# Patient Record
Sex: Female | Born: 1985 | Race: White | Hispanic: No | Marital: Single | Smoking: Never smoker
Health system: Southern US, Community
[De-identification: ages and names within clinical notes are randomized; demographics above are authoritative.]

## PROBLEM LIST (undated history)

## (undated) DIAGNOSIS — D649 Anemia, unspecified: Secondary | ICD-10-CM

## (undated) DIAGNOSIS — T7840XA Allergy, unspecified, initial encounter: Secondary | ICD-10-CM

## (undated) DIAGNOSIS — F419 Anxiety disorder, unspecified: Secondary | ICD-10-CM

## (undated) HISTORY — PX: TUBAL LIGATION: SHX77

## (undated) HISTORY — DX: Allergy, unspecified, initial encounter: T78.40XA

## (undated) HISTORY — DX: Anemia, unspecified: D64.9

---

## 2012-07-05 ENCOUNTER — Ambulatory Visit (INDEPENDENT_AMBULATORY_CARE_PROVIDER_SITE_OTHER): Payer: BC Managed Care – PPO | Admitting: Family Medicine

## 2012-07-05 ENCOUNTER — Encounter: Payer: Self-pay | Admitting: Family Medicine

## 2012-07-05 VITALS — BP 114/70 | Wt 160.0 lb

## 2012-07-05 DIAGNOSIS — S139XXA Sprain of joints and ligaments of unspecified parts of neck, initial encounter: Secondary | ICD-10-CM

## 2012-07-05 DIAGNOSIS — S161XXA Strain of muscle, fascia and tendon at neck level, initial encounter: Secondary | ICD-10-CM

## 2012-07-05 NOTE — Progress Notes (Signed)
  Subjective:    Patient ID: Wanda Fleming, female    DOB: 09/29/1985, 26 y.o.   MRN: [id number]  HPI All walking her dog at night in an apartment complex, she slipped on leaves, fell backwards and hit her head she did not lose consciousness. She did have difficulty with headache after that. But no blurred vision, double vision ,nausea or vomiting. She also noted some left-sided neck pain next day. She notes the pain today but it has diminished slightly . No numbness, tingling or weakness in her arms.   Review of Systems     Objective:   Physical Exam Alert and in no distress. Full motion of the neck. No tenderness to palpation of the neck noted. Normal motor, sensory and DTRs.       Assessment & Plan:   1. Cervical strain, acute    recommend conservative care with heat and stretching as well as anti-inflammatory. If no improvement over the next several weeks, return here for reevaluation.

## 2012-07-05 NOTE — Patient Instructions (Signed)
Heat for 20 minutes 3 times per day. Stretch after you do the heat and you can use Advil or Aleve for pain relief. If you don't get back to normal after couple weeks give me a call come back in. You can take 2 Aleve twice a day

## 2012-08-07 ENCOUNTER — Encounter: Payer: Self-pay | Admitting: Family Medicine

## 2012-08-07 ENCOUNTER — Ambulatory Visit (INDEPENDENT_AMBULATORY_CARE_PROVIDER_SITE_OTHER): Payer: BC Managed Care – PPO | Admitting: Family Medicine

## 2012-08-07 VITALS — BP 100/74 | HR 72 | Ht 65.75 in | Wt 162.0 lb

## 2012-08-07 DIAGNOSIS — M79673 Pain in unspecified foot: Secondary | ICD-10-CM

## 2012-08-07 DIAGNOSIS — M79609 Pain in unspecified limb: Secondary | ICD-10-CM

## 2012-08-07 NOTE — Progress Notes (Signed)
Chief Complaint  Patient presents with  . Foot Pain    right foot pain x 3 weeks. Foot has been "popping." Pain around great toe area.    HPI:  Started having pain at the base of her big toe, into foot.  She has had some popping, possibly "in and out of place".  Popping isn't painful.  Yesterday it started hurting more while wearing boots, and by the end of the day it was much worse, and hard to bear weight on it.  This morning it is a little better.  Denies it ever swelling.  Iced it last night.  Denies any injury or trauma to right foot.  Also having some stiffness and discomfort in left ankle. Saw podiatrist and diagnosed with tendinitis.  Offered cortisone shot, but she declined, and used some OTC NSAIDs instead.  Used it only sporadically as it sometimes upset her stomach.  No past medical history on file. No past surgical history on file. Current outpatient prescriptions:Copper IUD, by Intrauterine route., Disp: , Rfl:  No Known Allergies  ROS:  Denies fevers, URI symptoms, cough, shortness of breath, abdominal pain, nausea, vomiting, diarrhea, skin rash, chest pain.  +neck pain, L ankle pain, R foot pain as per HPI  PHYSICAL EXAM: BP 100/74  Pulse 72  Ht 5' 5.75" (1.67 m)  Wt 162 lb (73.483 kg)  BMI 26.35 kg/m2  LMP 07/12/2012 Well developed, talkative female in no distress R foot--2+ pulse.  No edema Mild diffuse tenderness along L great toe--along top of toe, and along bottom (mild pain in this area with flexion/extension of toe against resistance).  Normal strength, sensation.  nontender at 1st MTP No effusion/warmth  ASSESSMENT/PLAN: 1. Foot pain    Likely is strain vs tendonitis, likely related to shoewear.  No evidence of significant arthritis or gout. OTC Aleve up to 2 BID x 10 days.  NSAID precautions reviewed. Proper shoewear--ensure wide enough, avoid heels, good supportive shoes--add arch support to her unsupportive shoes she has Consider seeing podiatrist for f/u  if ongoing feet pain

## 2012-08-07 NOTE — Patient Instructions (Signed)
Wear supportive shoes--add arch support if needed, vs getting new shoes.  Avoid heels.  Try and make sure shoes aren't too narrow, which put pressure on the painful joint at the base of the great toe.  Use Aleve 2 tablets twice daily with food for up to 10-14 days--use regularly until completely better (this should help your toe, ankle, neck, etc.).  Stop using, or decrease the dose, if it bothers your stomach.  Consider following up with podiatrist if having ongoing problems, as they can make orthotics

## 2012-12-24 ENCOUNTER — Encounter: Payer: Self-pay | Admitting: Family Medicine

## 2012-12-24 ENCOUNTER — Ambulatory Visit (INDEPENDENT_AMBULATORY_CARE_PROVIDER_SITE_OTHER): Payer: BC Managed Care – PPO | Admitting: Family Medicine

## 2012-12-24 VITALS — BP 120/76 | HR 72 | Ht 65.75 in | Wt 154.0 lb

## 2012-12-24 DIAGNOSIS — M62838 Other muscle spasm: Secondary | ICD-10-CM

## 2012-12-24 NOTE — Progress Notes (Signed)
Chief Complaint  Patient presents with  . Back Pain    and shoulder pain, seen at Urgent Care yesterday. Not getting much relief from the meds she is taking.   Went to [facility] with complaint of spasm in right shoulder and shoulder blade.  Started suddenly after reaching to get soap.  She was rx'd soma, diclofenac DR and vicodin.  She first took diclofenac and soma at 4:30, didn't get relief, so took hydrocodone at 6:30 (2 tabs).  That also didn't seem to help.  Took another muscle relaxer at bedtime, but other than feeling slightly tired, got no relief.  She has been icing the area--?slight benefit.  She woke up at 12:30 am with nausea, vomiting, clammy.  After vomiting, she was able to have some saltines and get back to bed.  Today she has a little more range of motion, but still is in a lot of pain.  She didn't take anything today yet, afraid of which to take, what made her sick.  Denies getting any sort of sedation from the medications.  She was told in Urgent Care that she should be feeling a lot better today, and she isn't.  History reviewed. No pertinent past medical history. History reviewed. No pertinent past surgical history. History   Social History  . Marital Status: Single    Spouse Name: N/A    Number of Children: N/A  . 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 Smoking status: Never Smoker   . Smokeless tobacco: Never Used  . Alcohol Use: Yes     Comment: 3 drinks per week.  . Drug Use: No  . Sexually Active: Yes -- Female partner(s)    Birth Control/ Protection: IUD, Condom     Comment: copper IUD   Other Topics Concern  . Not on file   Social History Narrative   Lives alone, with 1 dog   Current outpatient prescriptions:carisoprodol (SOMA) 350 MG tablet, Take 175-350 mg by mouth 3 (three) times daily as needed for muscle spasms., Disp: , Rfl: ;  Copper IUD, by Intrauterine route., Disp: , Rfl:  ;  diclofenac (VOLTAREN) 75 MG EC tablet, Take 75 mg by mouth 2 (two) times daily., Disp: , Rfl: ;  HYDROcodone-acetaminophen (NORCO/VICODIN) 5-325 MG per tablet, Take 1 tablet by mouth every 6 (six) hours as needed for pain., Disp: , Rfl:   No Known Allergies  ROS:  Denies fevers, URI symptoms, chest pain, shortness of breath, numbness, tingling, weakness, skin rash.  She has been having some left knee instability--denies pain, but sometimes feels weak.  No swelling or recent injury.  PHYSICAL EXAM: BP 120/76  Pulse 72  Ht 5' 5.75" (1.67 m)  Wt 154 lb (69.854 kg)  BMI 25.05 kg/m2  LMP 12/05/2012 Well developed female, doesn't appear to be in acute distress Neck: no c-spine tenderness, no lymphadenopathy or mass Back: Spine nontender.  No CVA tenderness Minimally tender over R trapezius Very tender with spasm over R rhomboid muscles DTR's 2+ and symmetric.  Normal strength, sensation Skin: no rashes or lesions  ASSESSMENT/PLAN:  Muscle spasm of rhomboid muscles on right, and some in R trapezius.  No history or physical exam suggestions of radiculopathy, just muscle spasm. Offered (and declined) toradol  Discussed heat, stretches, massage She may switch to Aleve if repeat diclofenac dose causes side effcts or is ineffective (she reports Aleve usually helps--discussed can  substitute 2 Aleve twice daily with food in place of the diclofenac if she prefers) Continue soma TID prn muscle spasm, aware of possible sedating effects  Discussed massage, and referral to PT if not improving with these measures.  Use the hydrocodone only prn severe pain.  If pain not severe, but inadequately controlled with other meds, may use tylenol in place of the narcotic (but knows not to use them together, both containing acetaminophen).  All questions were answered (she had many). Advised to return another time to discuss her knee issues

## 2012-12-24 NOTE — Patient Instructions (Signed)
Continue the muscle relaxant as needed. Continue EITHER diclofenac OR Aleve I recommend trial of heat, stretches and massage. You may use the hydrocodone only if needed for severe pain--the most likely to cause nausea of all the meds.  Call for referral to PT if ongoing problems.  Return for re-evaluation if worsening pain, weakness, numbness, or other symptoms develop.

## 2013-06-20 ENCOUNTER — Other Ambulatory Visit: Payer: Self-pay

## 2013-12-18 ENCOUNTER — Encounter: Payer: Self-pay | Admitting: Family Medicine

## 2013-12-18 ENCOUNTER — Ambulatory Visit (INDEPENDENT_AMBULATORY_CARE_PROVIDER_SITE_OTHER): Payer: BC Managed Care – PPO | Admitting: Family Medicine

## 2013-12-18 VITALS — BP 120/74 | HR 72 | Ht 67.0 in | Wt 152.0 lb

## 2013-12-18 DIAGNOSIS — M222X9 Patellofemoral disorders, unspecified knee: Secondary | ICD-10-CM

## 2013-12-18 DIAGNOSIS — M25569 Pain in unspecified knee: Secondary | ICD-10-CM

## 2013-12-18 DIAGNOSIS — M25562 Pain in left knee: Secondary | ICD-10-CM

## 2013-12-18 NOTE — Patient Instructions (Signed)
Patellofemoral Syndrome  If you have had pain in the front of your knee for a long time, chances are good that you have patellofemoral syndrome. The word patella refers to the kneecap. Femoral (or femur) refers to the thigh bone. That is the bone the kneecap sits on. The kneecap is shaped like a triangle. Its job is to protect the knee and to improve the efficiency of your thigh muscles (quadriceps). The underside of the kneecap is made of smooth tissue (cartilage). This lets the kneecap slide up and down as the knee moves. Sometimes this cartilage becomes soft. Your healthcare provider may say the cartilage breaks down. That is patellofemoral syndrome. It can affect one knee, or both. The condition is sometimes called patellofemoral pain syndrome. That is because the condition is painful. The pain usually gets worse with activity. Sitting for a long time with the knee bent also makes the pain worse. It usually gets better with rest and proper treatment.  CAUSES   No one is sure why some people develop this problem and others do not. Runners often get it. One name for the condition is "runner's knee." However, some people run for years and never have knee pain. Certain things seem to make patellofemoral syndrome more likely. They include:  · Moving out of alignment. The kneecap is supposed to move in a straight line when the thigh muscle pulls on it. Sometimes the kneecap moves in poor alignment. That can make the knee swell and hurt. Some experts believe it also wears down the cartilage.  · Injury to the kneecap.  · Strain on the knee. This may occur during sports activity. Soccer, running, skiing and cycling can put excess stress on the knee.  · Being flat-footed or knock-kneed.  SYMPTOMS   · Knee pain.  · Pain under the kneecap. This is usually a dull, aching pain.  · Pain in the knee when doing certain things: squatting, kneeling, going up or down stairs.  · Pain in the knee when you stand up after sitting down  for awhile.  · Tightness in the knee.  · Loss of muscle strength in the thigh.  · Swelling of the knee.  DIAGNOSIS   Healthcare providers often send people with knee pain to an orthopedic caregiver. This person has special training to treat problems with bones and joints. To decide what is causing your knee pain, your caregiver will probably:  · Do a physical exam. This will probably include:  · Asking about symptoms you have noticed.  · Asking about your activities and any injuries.  · Feeling your knee. Moving it. This will help test the knee's strength. It will also check alignment (whether the knee and leg are aligned normally).  · Order some tests, such as:  · Imaging tests. They create pictures of the inside of the knee. Tests may include:  · X-rays.  · Computed tomography (CT) scan. This uses X-rays and a computer to show more detail.  · Magnetic resonance imaging (MRI). This test uses magnets, radio waves and a computer to make pictures.  TREATMENT   · Medication is almost always used first. It can relieve pain. It also can reduce swelling. Non-steroidal anti-inflammatory medicines (called NSAIDs) are usually suggested. Sometimes a stronger form is needed. A stronger form would require a prescription.  · Other treatment may be needed after the swelling goes down. Possibilities include:  · Exercise. Certain exercises can make the muscles around the knee stronger which decreases the   pressure on the knee cap. This includes the thigh muscle. Certain exercises also may be suggested to increase your flexibility.  · A knee brace. This gives the knee extra support and helps align the movement of the knee cap.  · Orthotics. These are special shoe inserts. They can help keep your leg and knee aligned.  · Surgery is sometimes needed. This is rare. Options include:  · Arthroscopy. The surgeon uses a special tool to remove any damaged pieces of the kneecap. Only a few small incisions (cuts) are needed.  · Realignment.  This is open surgery. The goals are to reduce pressure and fix the way the kneecap moves.  HOME CARE INSTRUCTIONS   · Take any medication prescribed by your healthcare provider. Follow the directions carefully.  · If your knee is swollen:  · Put ice or cold packs on it. Do this for 20 to 30 minutes, 3 to 4 times a day.  · Keep the knee raised. Make sure it is supported. Put a pillow under it.  · Rest your knee. For example, take the elevator instead of the stairs for awhile. Or, take a break from sports activity that strain your knee. Try walking or swimming instead.  · Whenever you are active:  · Use an elastic bandage on your knee. This gives it support.  · After any activity, put ice or cold packs on your knees. Do this for about 10 to 20 minutes.  · Make sure you wear shoes that give good support. Make sure they are not worn down. The heels should not slant in or out.  SEEK MEDICAL CARE IF:   · Knee pain gets worse. Or it does not go away, even after taking pain medicine.  · Swelling does not go down.  · Your thigh muscle becomes weak.  · You have an oral temperature above 102° F (38.9° C).  SEEK IMMEDIATE MEDICAL CARE IF:   You have an oral temperature above 102° F (38.9° C), not controlled by medicine.  Document Released: 07/20/2009 Document Revised: 10/24/2011 Document Reviewed: 07/20/2009  Exit[employer]® Patient Information ©2014 [employer].
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
## 2013-12-31 ENCOUNTER — Telehealth: Payer: Self-pay | Admitting: Family Medicine

## 2013-12-31 NOTE — Telephone Encounter (Signed)
She does not need OV if just requesting referral for PT.  Is there a particular place she is requesting?  [facility] for PT?

## 2013-12-31 NOTE — Telephone Encounter (Signed)
She was recently seen for knee pain, mentioned briefly her upper back pain, that chiro had been helping some.  I haven't evaluated her for this pain since 12/2012.  I am happy to refer her to PT, based on our discussion.  If she is asking referral elsewhere, she would need eval in order for me to determine diagnosis and who to send to (I doubt she actually needs anything more than PT).  Okay to refer to PT for evaluation and treatment of her pain--confirm that it was upper back pain; refer to [facility], unless she has a preference for elsewhere

## 2013-12-31 NOTE — Telephone Encounter (Signed)
LMOM FOR THE PATIENT TO CB. CLS 

## 2013-12-31 NOTE — Telephone Encounter (Signed)
Dr. Lynelle DoctorKnapp I spoke with the patient and she states that she is more interested in getting a massage and a structural integrated therapy setting. She needs it this way so it will be covered under her flex plan. Is it okay to do the referral or does she need to come in for a OV? CLS

## 2014-01-01 NOTE — Telephone Encounter (Signed)
I fax over the referral to [facility] for a therapy massage  Per patient request and Dr. Lynelle DoctorKnapp approved. CLS They will contact the patient to set up the appointment. CLS [address] [place], [place] [id number] [phone]

## 2014-02-13 ENCOUNTER — Telehealth: Payer: Self-pay | Admitting: Internal Medicine

## 2014-02-13 NOTE — Telephone Encounter (Signed)
written

## 2014-02-13 NOTE — Telephone Encounter (Signed)
Pt called back and the place Theodoro GristCheryl Dalton works at is [employer]

## 2014-02-13 NOTE — Telephone Encounter (Signed)
Pt states she needs a referral order for [facility] for back and neck pain, this is a long term therapy for 12-22 months She has an appt tomorrow with Theodoro Gristheryl Dalton. Pt has seen a chiropiractor for over a year and nothing has helped. She got a call from [facility] for an appt for massage therapy but she is not wanting to go there. If there is any questions you have, pt states to call her @ [phone]

## 2014-02-13 NOTE — Telephone Encounter (Signed)
She wasn't seen for back/neck pain.  She was last seen 12/2013 for knee pain.  She was seen 12/2012 for acute right shoulder pain with muscle spasm. We had talked about PT at both of these visits.  Has she checked with her insurance to see if ROLF therapy is covered? (and also whether or not referral is needed?)  We generally don't do referrals for patients that haven't been evaluated here for the specific problem. I honestly don't have much experience with ROLF therapy, to know how it might be different from types of therapy that are offered from routine PT (that has been recommended)

## 2014-02-13 NOTE — Telephone Encounter (Signed)
Patient advised.

## 2014-02-13 NOTE — Telephone Encounter (Signed)
Spoke with patient and she believes that ROLFING in NOT covered through her insurance. If she has an rx she can however use her flex account. She states that rx should say "[facility]rapeutic [facility] as needed for neck and back pain." I told her I would ask if you were willing to write and I would call her back.

## 2014-10-27 ENCOUNTER — Ambulatory Visit (INDEPENDENT_AMBULATORY_CARE_PROVIDER_SITE_OTHER): Payer: BLUE CROSS/BLUE SHIELD | Admitting: Family Medicine

## 2014-10-27 ENCOUNTER — Other Ambulatory Visit: Payer: Self-pay | Admitting: Family Medicine

## 2014-10-27 ENCOUNTER — Encounter: Payer: Self-pay | Admitting: Family Medicine

## 2014-10-27 VITALS — BP 100/60 | HR 76 | Ht 67.0 in | Wt 154.6 lb

## 2014-10-27 DIAGNOSIS — E559 Vitamin D deficiency, unspecified: Secondary | ICD-10-CM

## 2014-10-27 DIAGNOSIS — Z114 Encounter for screening for human immunodeficiency virus [HIV]: Secondary | ICD-10-CM

## 2014-10-27 DIAGNOSIS — R5383 Other fatigue: Secondary | ICD-10-CM

## 2014-10-27 DIAGNOSIS — F411 Generalized anxiety disorder: Secondary | ICD-10-CM

## 2014-10-27 DIAGNOSIS — Z13220 Encounter for screening for lipoid disorders: Secondary | ICD-10-CM | POA: Diagnosis not present

## 2014-10-27 LAB — CBC WITH DIFFERENTIAL/PLATELET
BASOS ABS: 0 10*3/uL (ref 0.0–0.1)
Basophils Relative: 0 % (ref 0–1)
EOS PCT: 3 % (ref 0–5)
Eosinophils Absolute: 0.2 10*3/uL (ref 0.0–0.7)
HCT: 36.3 % (ref 36.0–46.0)
HEMOGLOBIN: 11.9 g/dL — AB (ref 12.0–15.0)
LYMPHS PCT: 38 % (ref 12–46)
Lymphs Abs: 2.2 10*3/uL (ref 0.7–4.0)
MCH: 29 pg (ref 26.0–34.0)
MCHC: 32.8 g/dL (ref 30.0–36.0)
MCV: 88.3 fL (ref 78.0–100.0)
MPV: 10.1 fL (ref 8.6–12.4)
Monocytes Absolute: 0.4 10*3/uL (ref 0.1–1.0)
Monocytes Relative: 7 % (ref 3–12)
NEUTROS ABS: 3 10*3/uL (ref 1.7–7.7)
NEUTROS PCT: 52 % (ref 43–77)
PLATELETS: 308 10*3/uL (ref 150–400)
RBC: 4.11 MIL/uL (ref 3.87–5.11)
RDW: 13 % (ref 11.5–15.5)
WBC: 5.7 10*3/uL (ref 4.0–10.5)

## 2014-10-27 LAB — COMPREHENSIVE METABOLIC PANEL
ALBUMIN: 4.2 g/dL (ref 3.5–5.2)
ALK PHOS: 50 U/L (ref 39–117)
ALT: 15 U/L (ref 0–35)
AST: 21 U/L (ref 0–37)
BILIRUBIN TOTAL: 0.4 mg/dL (ref 0.2–1.2)
BUN: 12 mg/dL (ref 6–23)
CALCIUM: 9 mg/dL (ref 8.4–10.5)
CO2: 27 mEq/L (ref 19–32)
Chloride: 103 mEq/L (ref 96–112)
Creat: 0.61 mg/dL (ref 0.50–1.10)
GLUCOSE: 77 mg/dL (ref 70–99)
Potassium: 4.7 mEq/L (ref 3.5–5.3)
SODIUM: 140 meq/L (ref 135–145)
TOTAL PROTEIN: 6.9 g/dL (ref 6.0–8.3)

## 2014-10-27 LAB — LIPID PANEL
CHOL/HDL RATIO: 2.9 ratio
CHOLESTEROL: 180 mg/dL (ref 0–200)
HDL: 62 mg/dL (ref >=46)
LDL Cholesterol: 109 mg/dL — ABNORMAL HIGH (ref 0–99)
TRIGLYCERIDES: 46 mg/dL (ref <150)
VLDL: 9 mg/dL (ref 0–40)

## 2014-10-27 LAB — TSH: TSH: 1.397 u[IU]/mL (ref 0.350–4.500)

## 2014-10-27 NOTE — Progress Notes (Signed)
Chief Complaint  Patient presents with  . Advice Only    has strong family history of thyroid issues. Has been having fatigue and some nervousness-increase in anxiety lately. Has noticed some stomach issues, constipation and diarrhea at times. Has trouble focusing.    "I'm not feeling 100%" Reports feeling exhausted, despite sleeping 8 hours/night (pretty good sleep, sometimes a light sleeper, falls back to sleep easily).  Anxiety has been a little worse lately--thoughts repeating in her head, overly worried about things.  She hides it well, and it does NOT interfere with her sleep.  Has work stress, but nothing different than 6 months ago.  No new stressors.  She "comes home and is dead" Hasn't been exercising as much in the winter, but has continued to get weight-training 2x/week (seeing a trainer), which has helped her with her ongoing back issues.  Walks dog daily, 30 minutes in the morning. She has found she has some trouble concentrating, making decisions (not new). Denies hair loss, skin changes (dry in the winter), weight changes. Stomach has been "weird" the last few months--some constipation, some loose stools--inconsistent.   History reviewed. No pertinent past medical history.  History reviewed. No pertinent past surgical history.  History   Social History  . Marital Status: Single    Spouse Name: N/A  . Number of Children: N/A  . 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 Smoking status: Never Smoker   . Smokeless tobacco: Never Used  . Alcohol Use: Yes     Comment: 3 drinks per week.  . Drug Use: No  . Sexual Activity:    Partners: Male    Birth Control/ Protection: IUD, Condom     Comment: copper IUD   Other Topics Concern  . Not on file   Social History Narrative   Lives alone, with 1 dog    Family History  Problem Relation Age of Onset  . Hashimoto's thyroiditis Sister   . Polycystic  ovary syndrome Sister   . Diabetes Maternal Aunt   . Cancer Maternal Aunt     breast  . Breast cancer Maternal Aunt     early 5650's  . Diabetes Paternal Aunt   . Thyroid disease Paternal Aunt   . Polycystic ovary syndrome Cousin     Outpatient Encounter Prescriptions as of 10/27/2014  Medication Sig Note  . Copper IUD by Intrauterine route.   . Multiple Vitamins-Minerals (MULTIVITAMIN GUMMIES ADULT) CHEW Chew 2 each by mouth daily. 10/27/2014: Started last week    Allergies  Allergen Reactions  . Ibuprofen Nausea And Vomiting   ROS:  Denies fevers, chills, cough, shortness of breath, wheezing, chest pain, nausea, vomiting, heartburn.  +bowel changes recently as per HPI.  No blood in stools or abdominal pain. Denies dizziness. Having some stress/tension headaches posteriorly Slightly sniffly over the last week or so. No cough, sore throat or other URI symptoms  PHYSICAL EXAM: BP 100/60 mmHg  Pulse 76  Ht 5\' 7"  (1.702 m)  Wt 154 lb 9.6 oz (70.126 kg)  BMI 24.21 kg/m2  LMP 10/19/2014 Well developed, pleasant, well-appearing female in no distress HEENT: PERRL, EOMI, conjunctiva clear.  TM's and EAC's normal. Nasal mucosa without erythema, purulence. OP is clear, sinuses nontender Neck: no lymphadenopathy, thyromegaly or mass Heart: regular rate and rhythm without murmur Lungs: clear bilaterally Abdomen: soft, nontender, no organomegaly or mass Extremities: no edema Skin: no rash, normal  texture. Neuro: alert and oriented.  Normal strength, gait Psych: slightly anxious.  Normal affect, hygiene and grooming, eye contact and speech  ASSESSMENT/PLAN:  Anxiety state - eval for underlying causes--if tests neg, then work on stress reduction, relaxation; consider counseling, meds (return if needed)  Other fatigue - r/o underlying causes.  allergies may trigger--treat with antihistamines - Plan: Comprehensive metabolic panel, CBC with Differential/Platelet, Vit D  25 hydroxy (rtn  osteoporosis monitoring), TSH  Screening for lipoid disorders - Plan: Lipid panel  Screening for HIV (human immunodeficiency virus) - Plan: HIV antibody   We are checking bloodwork to evaluate for any treatable causes for your symptoms.  We will be in contact within 1-2 days with your results.  If they are normal, or if when things are adjusted for (ie treating anemia) if your anxiety persists, consider treatment.  Treatments for anxiety include relaxation techniques, counseling, and medication.  Return for discussion of medication if/when needed.  Consider claritin or allegra for treatment of seasonal allergies (which can contribute to fatigue).  Avoid the "D" versions if you don't tolerate sudafed.  Consider trial of probiotics to help regulate your bowels.  Pay attention to your diet (specifically lactose) to see if there are triggers.  [facility] if needed

## 2014-10-27 NOTE — Patient Instructions (Signed)
We are checking bloodwork to evaluate for any treatable causes for your symptoms.  We will be in contact within 1-2 days with your results.  If they are normal, or if when things are adjusted for (ie treating anemia) if your anxiety persists, consider treatment.  Treatments for anxiety include relaxation techniques, counseling, and medication.  Return for discussion of medication if/when needed.  Consider claritin or allegra for treatment of seasonal allergies (which can contribute to fatigue).  Avoid the "D" versions if you don't tolerate sudafed.  Consider trial of probiotics to help regulate your bowels.  Pay attention to your diet (specifically lactose) to see if there are triggers.

## 2014-10-28 ENCOUNTER — Encounter: Payer: Self-pay | Admitting: Family Medicine

## 2014-10-28 LAB — HIV ANTIBODY (ROUTINE TESTING W REFLEX): HIV: NONREACTIVE

## 2014-10-28 NOTE — Addendum Note (Signed)
Addended by: Lavell IslamHOTON, Agam Davenport M on: 10/28/2014 01:34 PM   Modules accepted: Orders

## 2014-10-29 LAB — VITAMIN D 25 HYDROXY (VIT D DEFICIENCY, FRACTURES): Vit D, 25-Hydroxy: 23 ng/mL — ABNORMAL LOW (ref 30–100)

## 2014-10-29 MED ORDER — VITAMIN D (ERGOCALCIFEROL) 1.25 MG (50000 UNIT) PO CAPS: 50000.0000 [IU] | ORAL_CAPSULE | ORAL | Status: DC

## 2014-10-29 NOTE — Addendum Note (Signed)
Addended by: Joselyn ArrowKNAPP, Teng Decou on: 10/29/2014 08:39 AM   Modules accepted: Orders

## 2015-05-26 ENCOUNTER — Encounter: Payer: Self-pay | Admitting: Family Medicine

## 2016-11-08 ENCOUNTER — Encounter: Payer: Self-pay | Admitting: Family Medicine

## 2016-11-08 ENCOUNTER — Ambulatory Visit (INDEPENDENT_AMBULATORY_CARE_PROVIDER_SITE_OTHER): Payer: 59 | Admitting: Family Medicine

## 2016-11-08 VITALS — BP 120/70 | HR 89 | Temp 98.7°F | Resp 16 | Wt 164.2 lb

## 2016-11-08 DIAGNOSIS — R05 Cough: Secondary | ICD-10-CM | POA: Diagnosis not present

## 2016-11-08 DIAGNOSIS — J029 Acute pharyngitis, unspecified: Secondary | ICD-10-CM

## 2016-11-08 DIAGNOSIS — R059 Cough, unspecified: Secondary | ICD-10-CM

## 2016-11-08 LAB — POCT RAPID STREP A (OFFICE): RAPID STREP A SCREEN: NEGATIVE

## 2016-11-08 MED ORDER — PROMETHAZINE-DM 6.25-15 MG/5ML PO SYRP: 5.0000 mL | ORAL_SOLUTION | Freq: Every evening | ORAL | 0 refills | Status: DC | PRN

## 2016-11-08 MED ORDER — AZITHROMYCIN 250 MG PO TABS: ORAL_TABLET | ORAL | 0 refills | Status: DC

## 2016-11-08 NOTE — Patient Instructions (Signed)
Your strep swab is negative.  I suspect your symptoms are related to a viral illness and recommend treating your symptoms at this point.  Mucinex DM for congestion and cough, drink extra water, use salt water gargles and chloraseptic for throat irritation and Tylenol or Ibuprofen for aches and pains.   If you are not improving by days 7-10 of your illness or if your symptoms worsen then start the antibiotic.  Call if you are not back to baseline after finishing the antibiotic.

## 2016-11-08 NOTE — Progress Notes (Signed)
Subjective: Chief Complaint  Patient presents with  . sick    started thursday- tired, fever 100.3, cough, body aches first 2 days. drianage, sore throat     Wanda Fleming is a 31 y.o. female who presents for a 6 day history of fatigue, body aches, low grade fever. States those symptoms are improving but she has had a sore throat and cough that is getting worse. Dry cough with occasionally productive with yellowish sputum.   Denies ear pain, rhinorrhea, nasal congestion, chest pain, palpitations, shortness of breath, wheezing, abdominal pain, N/V/D.   History of recurrent strep as a child.  Denies history of pneumonia, bronchitis, asthma.  No recent antibiotic history.  Does not smoke.    Treatment to date: neti pot, Aleve, Zinc.  Denies sick contacts.  No other aggravating or relieving factors.  No other c/o.  LMP: 10/26/2016   ROS as in subjective.   Objective: Vitals:   11/08/16 0937  BP: 120/70  Pulse: 89  Resp: 16  Temp: 98.7 F (37.1 C)    General appearance: Alert, WD/WN, no distress, mildly ill appearing                             Skin: warm, no rash                           Head: no sinus tenderness                            Eyes: conjunctiva normal, corneas clear, PERRLA                            Ears: pearly TMs, external ear canals normal                          Nose: septum midline, turbinates swollen, with erythema and clear discharge             Mouth/throat: MMM, tongue normal, mild pharyngeal erythema                           Neck: supple, no adenopathy, no thyromegaly, nontender                          Heart: RRR, normal S1, S2, no murmurs                         Lungs: CTA bilaterally, no wheezes, rales, or rhonchi      Assessment: Acute pharyngitis, unspecified etiology  Sore throat - Plan: POCT rapid strep A  Cough    Plan: Discussed diagnosis and treatment of URI. Rapid strep is negative.  Suggested symptomatic OTC remedies. Will  send in Z-pack but she will wait 2-3 days and do symptomatic treatment and then take the antibiotic if needed. Promethazine DM sent to pharmacy for bedtime per patient request. Does not appear to have a sinus infection at this point. Mucinex for cough and congestion. Salt water gargles for sore throat. Tylenol or Ibuprofen OTC for fever and malaise.  Call/return if not back to baseline day 10 of taking the antibiotic if she ends up taking it.

## 2017-04-04 ENCOUNTER — Encounter: Payer: Self-pay | Admitting: Family Medicine

## 2017-04-04 ENCOUNTER — Ambulatory Visit (INDEPENDENT_AMBULATORY_CARE_PROVIDER_SITE_OTHER): Payer: 59 | Admitting: Family Medicine

## 2017-04-04 VITALS — BP 108/70 | HR 123 | Temp 98.1°F | Wt 168.8 lb

## 2017-04-04 DIAGNOSIS — W57XXXA Bitten or stung by nonvenomous insect and other nonvenomous arthropods, initial encounter: Secondary | ICD-10-CM | POA: Diagnosis not present

## 2017-04-04 DIAGNOSIS — S80862A Insect bite (nonvenomous), left lower leg, initial encounter: Secondary | ICD-10-CM | POA: Diagnosis not present

## 2017-04-04 NOTE — Progress Notes (Signed)
   Subjective:    Patient ID: Wanda Fleming, female    DOB: 09/28/1985, 31 y.o.   MRN: [id number]  HPI She was apparently bitten on the ankle by an unknown insect last Saturday. It did swell up. She did show me pictures of it yesterday which did show some swelling and induration however today there is mainly a well-demarcated area of erythema. She does have some distal swelling does complain of slight discomfort in that area.   Review of Systems     Objective:   Physical Exam Alert and in no distress. A 3 x 4 cm area of erythema with discrete margins is noted. It is not hot or tender. There is some distal edema.       Assessment & Plan:  Insect bite of left lower extremity, initial encounter I explained that she seems to be healing up nicely and the erythema is probably related to the toxin from the insect. Recommend supportive care.

## 2017-08-01 DIAGNOSIS — Z01419 Encounter for gynecological examination (general) (routine) without abnormal findings: Secondary | ICD-10-CM | POA: Diagnosis not present

## 2017-08-01 LAB — RESULTS CONSOLE HPV: CHL HPV: NEGATIVE

## 2017-08-01 LAB — HM PAP SMEAR: HM Pap smear: NEGATIVE

## 2017-09-15 DIAGNOSIS — Z809 Family history of malignant neoplasm, unspecified: Secondary | ICD-10-CM | POA: Diagnosis not present

## 2017-09-15 DIAGNOSIS — J069 Acute upper respiratory infection, unspecified: Secondary | ICD-10-CM | POA: Diagnosis not present

## 2017-10-13 DIAGNOSIS — S0501XA Injury of conjunctiva and corneal abrasion without foreign body, right eye, initial encounter: Secondary | ICD-10-CM | POA: Diagnosis not present

## 2017-10-22 DIAGNOSIS — R509 Fever, unspecified: Secondary | ICD-10-CM | POA: Diagnosis not present

## 2017-10-22 DIAGNOSIS — R5383 Other fatigue: Secondary | ICD-10-CM | POA: Diagnosis not present

## 2017-12-17 ENCOUNTER — Encounter: Payer: Self-pay | Admitting: Family Medicine

## 2018-07-15 ENCOUNTER — Encounter: Payer: Self-pay | Admitting: Family Medicine

## 2018-07-15 DIAGNOSIS — E559 Vitamin D deficiency, unspecified: Secondary | ICD-10-CM | POA: Insufficient documentation

## 2018-07-15 NOTE — Progress Notes (Signed)
Error - duplicate

## 2018-07-16 ENCOUNTER — Ambulatory Visit (INDEPENDENT_AMBULATORY_CARE_PROVIDER_SITE_OTHER): Payer: 59 | Admitting: Family Medicine

## 2018-07-16 ENCOUNTER — Encounter: Payer: Self-pay | Admitting: Family Medicine

## 2018-07-16 VITALS — BP 110/72 | HR 84 | Ht 65.5 in | Wt 175.8 lb

## 2018-07-16 DIAGNOSIS — R635 Abnormal weight gain: Secondary | ICD-10-CM | POA: Diagnosis not present

## 2018-07-16 DIAGNOSIS — E559 Vitamin D deficiency, unspecified: Secondary | ICD-10-CM

## 2018-07-16 DIAGNOSIS — Z23 Encounter for immunization: Secondary | ICD-10-CM | POA: Diagnosis not present

## 2018-07-16 DIAGNOSIS — Z0000 Encounter for general adult medical examination without abnormal findings: Secondary | ICD-10-CM | POA: Diagnosis not present

## 2018-07-16 LAB — POCT URINALYSIS DIP (PROADVANTAGE DEVICE)
Bilirubin, UA: NEGATIVE
Blood, UA: NEGATIVE
GLUCOSE UA: NEGATIVE mg/dL
Ketones, POC UA: NEGATIVE mg/dL
Leukocytes, UA: NEGATIVE
NITRITE UA: NEGATIVE
PROTEIN UA: NEGATIVE mg/dL
SPECIFIC GRAVITY, URINE: 1.02
Urobilinogen, Ur: NEGATIVE
pH, UA: 5.5 (ref 5.0–8.0)

## 2018-07-16 NOTE — Progress Notes (Signed)
Chief Complaint  Patient presents with  . Annual Exam    nonfasting annual exam, no pap sees Rubbie Battiest, NP @ [facility]. Just had eye exam at [facility].     Wanda Fleming is a 32 y.o. female who presents for a complete physical.  She sees GYN.  She has the following concerns:  She reports having a little more anxiety, grief and stress lately. Trying to figure out ways to manage it. Knows she should be getting more regular exercise, but hasn't lately.  Previously has done triathlons, and plans to start training again.    She hasn't been seen by me in over 3 years.  Last saw [facility] for insect bite in 2018. She went to [facility] in 10/2017 with the flu. (she never took the Tamiflu) She got her flu shot this year.  She last saw her GYN earlier this year (at [facility]). She still has paragard copper IUD, placed 04/13. She had genetic testing (BRCA and others) through her GYN, reportedly normal.  Vitamin D deficiency--level was low at 23 in 2016.  She is currently taking MVI daily (gummy) and a vitamin D gummy.   Immunization History  Administered Date(s) Administered  . Influenza Inj Mdck Quad Pf 06/12/2018  cannot recall her last tetanus, may have gotten one 6 years ago, is unsure (prefers to get another rather than trying to find out exactly when she had it, [place])  Last Pap smear: within the year, normal. Last mammogram: never Last colonoscopy: never Last DEXA: never Dentist: twice a year Ophtho: yearly Exercise: 30 minutes a few days/week (gets close to 10K steps/d). Not much weight-bearing exercise. She gets reimbursed through her job for being active.  Lipids: Lab Results  Component Value Date   CHOL 180 10/27/2014   HDL 62 10/27/2014   LDLCALC 109 (H) 10/27/2014   TRIG 46 10/27/2014   CHOLHDL 2.9 10/27/2014   Worse diet in the last year, related to grief and poor food choices. Had blood drawn earlier today, when  fasting  History reviewed. No pertinent past medical history.  History reviewed. No pertinent surgical history.  Social History   Socioeconomic History  . Marital status: Single    Spouse name: Not on file  . Number of children: Not on file  . Years of education: Not on file  . Highest education level: Not on file  Occupational History  . Occupation: Radio producer write for [employer]  . Financial resource strain: Not on file  . Food insecurity:    Worry: Not on file    Inability: Not on file  . Transportation needs:    Medical: Not on file    Non-medical: Not on file  Tobacco Use  . Smoking status: Never Smoker  . Smokeless tobacco: Never Used  Substance and Sexual Activity  . Alcohol use: Yes    Comment: 1-2 drinks per week.  . Drug use: No  . Sexual activity: Not Currently    Partners: Male    Birth control/protection: IUD, Condom    Comment: copper IUD (placed 11/2011)  Lifestyle  . Physical activity:    Days per week: Not on file    Minutes per session: Not on file  . Stress: Not on file  Relationships  . Social connections:    Talks on phone: Not on file    Gets together: Not on file    Attends religious service: Not on file  Active member of club or organization: Not on file    Attends meetings of clubs or organizations: Not on file    Relationship status: Not on file  . Intimate partner violence:    Fear of current or ex partner: Not on file    Emotionally abused: Not on file    Physically abused: Not on file    Forced sexual activity: Not on file  Other Topics Concern  . Not on file  Social History Narrative   Lives alone, with 1 dog.   Works as a Barrister's clerk for the [employer]    Family History  Problem Relation Age of Onset  . Hashimoto's thyroiditis Sister   . Polycystic ovary syndrome Sister   . Diabetes Maternal Aunt   . Cancer Maternal Aunt        breast  . Breast cancer Maternal Aunt        early 39's  . Diabetes Paternal  Aunt   . Thyroid disease Paternal Aunt   . Polycystic ovary syndrome Cousin     Outpatient Encounter Medications as of 07/16/2018  Medication Sig  . Cholecalciferol (VITAMIN D3 ADULT GUMMIES PO) Take 3,000 Int'l Units by mouth daily.   . Copper IUD by Intrauterine route.  . Multiple Vitamins-Minerals (MULTIVITAMIN GUMMIES ADULT) CHEW Chew 2 each by mouth daily.  . [DISCONTINUED] azithromycin (ZITHROMAX Z-PAK) 250 MG tablet Take 2 tabs on day 1 then 1 tab days 2-5.  . [DISCONTINUED] promethazine-dextromethorphan (PROMETHAZINE-DM) 6.25-15 MG/5ML syrup Take 5 mLs by mouth at bedtime as needed for cough.  . [DISCONTINUED] Vitamin D, Ergocalciferol, (DRISDOL) 50000 UNITS CAPS capsule Take 1 capsule (50,000 Units total) by mouth every 7 (seven) days.   No facility-administered encounter medications on file as of 07/16/2018.     Allergies  Allergen Reactions  . Ibuprofen Nausea And Vomiting    ROS:  The patient denies anorexia, fever, headaches, vision changes, decreased hearing, ear pain, sore throat, breast concerns, chest pain, palpitations, dizziness, syncope, dyspnea on exertion, cough, swelling, nausea, vomiting, diarrhea, constipation, abdominal pain, melena, hematochezia, indigestion/heartburn, hematuria, incontinence, dysuria, irregular menstrual cycles, vaginal discharge, odor or itch, joint pains, numbness, tingling, weakness, tremor, suspicious skin lesions, depression, abnormal bleeding/bruising, or enlarged lymph nodes. Some increased anxiety over the past year. 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 Some weight gain noted.   PHYSICAL EXAM:  BP 110/72   Pulse 84   Ht 5' 5.5" (1.664 m)   Wt 175 lb 12.8 oz (79.7 kg)   LMP 07/14/2018 (Exact Date)   BMI 28.81 kg/m    Wt Readings from Last 3 Encounters:  07/16/18 175 lb 12.8 oz (79.7 kg)  04/04/17 168 lb 12.8 oz (76.6 kg)  11/08/16 164 lb 3.2 oz (74.5 kg)    General Appearance:     Alert, cooperative, no distress, appears stated age  Head:    Normocephalic, without obvious abnormality, atraumatic  Eyes:    PERRL, conjunctiva/corneas clear, EOM's intact, fundi    benign  Ears:    Normal TM's and external ear canals  Nose:   Nares normal, mucosa normal, no drainage or sinus   tenderness  Throat:   Lips, mucosa, and tongue normal; teeth and gums normal  Neck:   Supple, no lymphadenopathy;  thyroid:  no enlargement/ tenderness/nodules; no carotid bruit or JVD  Back:    Spine nontender, no curvature, ROM normal, no CVA     tenderness  Lungs:  Clear to auscultation bilaterally without wheezes, rales or     ronchi; respirations unlabored  Chest Wall:    No tenderness or deformity   Heart:    Regular rate and rhythm, S1 and S2 normal, no murmur, rub   or gallop  Breast Exam:    Deferred to GYN  Abdomen:     Soft, non-tender, nondistended, normoactive bowel sounds,    no masses, no hepatosplenomegaly  Genitalia:    Deferred to GYN     Extremities:   No clubbing, cyanosis or edema  Pulses:   2+ and symmetric all extremities  Skin:   Skin color, texture, turgor normal, no rashes or lesions  Lymph nodes:   Cervical, supraclavicular, and axillary nodes normal  Neurologic:   CNII-XII intact, normal strength, sensation and gait; reflexes 2+ and symmetric throughout         Psych:   Normal mood, affect, hygiene and grooming.    ASSESSMENT/PLAN:  Annual physical exam - Plan: POCT Urinalysis DIP (Proadvantage Device), Lipid panel, Comprehensive metabolic panel, CBC with Differential/Platelet, VITAMIN D 25 Hydroxy (Vit-D Deficiency, Fractures), TSH  Vitamin D deficiency - Plan: VITAMIN D 25 Hydroxy (Vit-D Deficiency, Fractures)  Need for Tdap vaccination - Plan: Tdap vaccine greater than or equal to 7yo IM  Weight gain - Plan: TSH   Discussed monthly self breast exams and yearly mammograms after the age of 90; at least 30 minutes of aerobic activity at least 5 days/week,  weight-bearing exercise at least 2x/week; proper sunscreen use reviewed; healthy diet, including goals of calcium and vitamin D intake and alcohol recommendations (less than or equal to 1 drink/day) reviewed; regular seatbelt use; changing batteries in smoke detectors.  Immunization recommendations discussed--Tdap given; continue yearly flu shots.  Colonoscopy recommendations reviewed (age 42-50)  Will get pap results from GYN, as well as labs (genetic testing)

## 2018-07-17 LAB — COMPREHENSIVE METABOLIC PANEL
ALBUMIN: 4.6 g/dL (ref 3.5–5.5)
ALK PHOS: 67 IU/L (ref 39–117)
ALT: 11 IU/L (ref 0–32)
AST: 17 IU/L (ref 0–40)
Albumin/Globulin Ratio: 2 (ref 1.2–2.2)
BUN/Creatinine Ratio: 21 (ref 9–23)
BUN: 14 mg/dL (ref 6–20)
Bilirubin Total: 0.2 mg/dL (ref 0.0–1.2)
CALCIUM: 9.2 mg/dL (ref 8.7–10.2)
CO2: 22 mmol/L (ref 20–29)
CREATININE: 0.66 mg/dL (ref 0.57–1.00)
Chloride: 101 mmol/L (ref 96–106)
GFR calc Af Amer: 136 mL/min/{1.73_m2} (ref >59)
GFR, EST NON AFRICAN AMERICAN: 118 mL/min/{1.73_m2} (ref >59)
GLOBULIN, TOTAL: 2.3 g/dL (ref 1.5–4.5)
GLUCOSE: 91 mg/dL (ref 65–99)
Potassium: 4.6 mmol/L (ref 3.5–5.2)
SODIUM: 138 mmol/L (ref 134–144)
Total Protein: 6.9 g/dL (ref 6.0–8.5)

## 2018-07-17 LAB — CBC WITH DIFFERENTIAL/PLATELET
BASOS ABS: 0 10*3/uL (ref 0.0–0.2)
Basos: 1 %
EOS (ABSOLUTE): 0.3 10*3/uL (ref 0.0–0.4)
Eos: 4 %
HEMOGLOBIN: 12.6 g/dL (ref 11.1–15.9)
Hematocrit: 37.6 % (ref 34.0–46.6)
Immature Grans (Abs): 0 10*3/uL (ref 0.0–0.1)
Immature Granulocytes: 0 %
LYMPHS ABS: 1.9 10*3/uL (ref 0.7–3.1)
LYMPHS: 28 %
MCH: 28.3 pg (ref 26.6–33.0)
MCHC: 33.5 g/dL (ref 31.5–35.7)
MCV: 85 fL (ref 79–97)
MONOCYTES: 6 %
Monocytes Absolute: 0.5 10*3/uL (ref 0.1–0.9)
NEUTROS ABS: 4.3 10*3/uL (ref 1.4–7.0)
Neutrophils: 61 %
PLATELETS: 335 10*3/uL (ref 150–450)
RBC: 4.45 x10E6/uL (ref 3.77–5.28)
RDW: 12.3 % (ref 12.3–15.4)
WBC: 7 10*3/uL (ref 3.4–10.8)

## 2018-07-17 LAB — LIPID PANEL
Chol/HDL Ratio: 3.7 ratio (ref 0.0–4.4)
Cholesterol, Total: 187 mg/dL (ref 100–199)
HDL: 50 mg/dL (ref >39)
LDL Calculated: 111 mg/dL — ABNORMAL HIGH (ref 0–99)
Triglycerides: 128 mg/dL (ref 0–149)
VLDL Cholesterol Cal: 26 mg/dL (ref 5–40)

## 2018-07-17 LAB — TSH: TSH: 1.87 u[IU]/mL (ref 0.450–4.500)

## 2018-07-17 LAB — VITAMIN D 25 HYDROXY (VIT D DEFICIENCY, FRACTURES): Vit D, 25-Hydroxy: 41.6 ng/mL (ref 30.0–100.0)

## 2018-07-17 NOTE — Patient Instructions (Signed)
  HEALTH MAINTENANCE RECOMMENDATIONS:  It is recommended that you get at least 30 minutes of aerobic exercise at least 5 days/week (for weight loss, you may need as much as 60-90 minutes). This can be any activity that gets your heart rate up. This can be divided in 10-15 minute intervals if needed, but try and build up your endurance at least once a week.  Weight bearing exercise is also recommended twice weekly.  Eat a healthy diet with lots of vegetables, fruits and fiber.  "Colorful" foods have a lot of vitamins (ie green vegetables, tomatoes, red peppers, etc).  Limit sweet tea, regular sodas and alcoholic beverages, all of which has a lot of calories and sugar.  Up to 1 alcoholic drink daily may be beneficial for women (unless trying to lose weight, watch sugars).  Drink a lot of water.  Calcium recommendations are 1200-1500 mg daily (1500 mg for postmenopausal women or women without ovaries), and vitamin D 1000 IU daily.  This should be obtained from diet and/or supplements (vitamins), and calcium should not be taken all at once, but in divided doses.  Monthly self breast exams and yearly mammograms for women over the age of 31 is recommended.  Sunscreen of at least SPF 30 should be used on all sun-exposed parts of the skin when outside between the hours of 10 am and 4 pm (not just when at beach or pool, but even with exercise, golf, tennis, and yard work!)  Use a sunscreen that says "broad spectrum" so it covers both UVA and UVB rays, and make sure to reapply every 1-2 hours.  Remember to change the batteries in your smoke detectors when changing your clock times in the spring and fall. Carbon monoxide detectors are recommended for the home.  Use your seat belt every time you are in a car, and please drive safely and not be distracted with cell phones and texting while driving.

## 2018-07-31 ENCOUNTER — Telehealth: Payer: Self-pay | Admitting: Family Medicine

## 2018-07-31 NOTE — Telephone Encounter (Signed)
Received requested records from [facility]. Sending back for review.  °

## 2018-08-02 ENCOUNTER — Encounter: Payer: Self-pay | Admitting: *Deleted

## 2018-08-07 ENCOUNTER — Encounter: Payer: Self-pay | Admitting: Family Medicine

## 2018-09-05 ENCOUNTER — Encounter: Payer: Self-pay | Admitting: Family Medicine

## 2018-12-12 ENCOUNTER — Encounter: Payer: Self-pay | Admitting: Family Medicine

## 2019-03-04 ENCOUNTER — Encounter: Payer: Self-pay | Admitting: Family Medicine

## 2019-03-05 NOTE — Telephone Encounter (Signed)
Patient notified and appointment set up for 03-06-19.

## 2019-03-05 NOTE — Patient Instructions (Addendum)
Keep your hands well moisturized.  Soap and water may be a little better than alcohol-based hand sanitizers, which are more drying. Use gloves when washing dishes and cleaning.  Use the steroid cream sparingly to the affected areas of skin, twice daily for up to 2 weeks.  Follow-up if rash is spreading/worsening, if there is pain, crusting or weeping, fever, or other new concerns.    Eczema Eczema is a broad term for a group of skin conditions that cause skin to become rough and inflamed. Each type of eczema has different triggers, symptoms, and treatments. Eczema of any type is usually itchy and symptoms range from mild to severe. Eczema and its symptoms are not spread from person to person (are not contagious). It can appear on different parts of the body at different times. Your eczema may not look the same as someone else's eczema. What are the types of eczema? Atopic dermatitis This is a long-term (chronic) skin disease that keeps coming back (recurring). Usual symptoms are dry skin and small, solid pimples that may swell and leak fluid (weep). Contact dermatitis  This happens when something irritates the skin and causes a rash. The irritation can come from substances that you are allergic to (allergens), such as poison ivy, chemicals, or medicines that were applied to your skin. Dyshidrotic eczema This is a form of eczema on the hands and feet. It shows up as very itchy, fluid-filled blisters. It can affect people of any age, but is more common before age 61. Hand eczema  This causes very itchy areas of skin on the palms and sides of the hands and fingers. This type of eczema is common in industrial jobs where you may be exposed to many different types of irritants. Lichen simplex chronicus This type of eczema occurs when a person constantly scratches one area of the body. Repeated scratching of the area leads to thickened skin (lichenification). Lichen simplex chronicus can occur along  with other types of eczema. It is more common in adults, but may be seen in children as well. Nummular eczema This is a common type of eczema. It has no known cause. It typically causes a red, circular, crusty lesion (plaque) that may be itchy. Scratching may become a habit and can cause bleeding. Nummular eczema occurs most often in people of middle-age or older. It most often affects the hands. Seborrheic dermatitis This is a common skin disease that mainly affects the scalp. It may also affect any oily areas of the body, such as the face, sides of nose, eyebrows, ears, eyelids, and chest. It is marked by small scaling and redness of the skin (erythema). This can affect people of all ages. In infants, this condition is known as Chartered certified accountant." Stasis dermatitis This is a common skin disease that usually appears on the legs and feet. It most often occurs in people who have a condition that prevents blood from being pumped through the veins in the legs (chronic venous insufficiency). Stasis dermatitis is a chronic condition that needs long-term management. How is eczema diagnosed? Your health care provider will examine your skin and review your medical history. He or she may also give you skin patch tests. These tests involve taking patches that contain possible allergens and placing them on your back. He or she will then check in a few days to see if an allergic reaction occurred. What are the common treatments? Treatment for eczema is based on the type of eczema you have. Hydrocortisone steroid medicine  can relieve itching quickly and help reduce inflammation. This medicine may be prescribed or obtained over-the-counter, depending on the strength of the medicine that is needed. Follow these instructions at home:  Take over-the-counter and prescription medicines only as told by your health care provider.  Use creams or ointments to moisturize your skin. Do not use lotions.  Learn what triggers or  irritates your symptoms. Avoid these things.  Treat symptom flare-ups quickly.  Do not itch your skin. This can make your rash worse.  Keep all follow-up visits as told by your health care provider. This is important. Where to find more information  The American Academy of Dermatology: InfoExam.siwww.aad.org  The National Eczema Association: www.nationaleczema.org Contact a health care provider if:  You have serious itching, even with treatment.  You regularly scratch your skin until it bleeds.  Your rash looks different than usual.  Your skin is painful, swollen, or more red than usual.  You have a fever. Summary  There are eight general types of eczema. Each type has different triggers.  Eczema of any type causes itching that may range from mild to severe.  Treatment varies based on the type of eczema you have. Hydrocortisone steroid medicine can help with itching and inflammation.  Protecting your skin is the best way to prevent eczema. Use moisturizers and lotions. Avoid triggers and irritants, and treat flare-ups quickly. This information is not intended to replace advice given to you by your health care provider. Make sure you discuss any questions you have with your health care provider. Document Released: 12/15/2016 Document Revised: 07/14/2017 Document Reviewed: 12/15/2016 Elsevier Patient Education  2020 [employer]

## 2019-03-05 NOTE — Progress Notes (Signed)
Start time: 11:25 End time: 11:44  Virtual Visit via Video Note  I connected with Wanda Fleming on 03/06/2019 by a video enabled telemedicine application and verified that I am speaking with the correct person using two identifiers.  Location: Patient: home Provider: office   I discussed the limitations of evaluation and management by telemedicine and the availability of in person appointments. The patient expressed understanding and agreed to proceed. She consents to insurance being filed for this visit.   History of Present Illness:  Chief Complaint  Patient presents with  . Eczema    VIRTUAL visit, eczema on both hands, mostly on L hand. Been there for a couple months.     Patient presents for evaluation of rash on her hand for the last couple of months, which she feels may be eczema, triggered by stress.  Remembers it starting mid-May, when seeing her mother in the hospital. Using a lot of hand sanitizer in the hospital, as well as hand washing from [place]. Flares when she washes dishes, and after showers.  Just got gloves for when washing dishes/cleaning.  "It keeps wanting to go away, but it just won't" Not usually itchy, just some days. Bought Eucerin, using the last few weeks.  Back of left hand, and between 3rd and 4th fingers on both hands. See photos she sent through [place] message received from patient 03/04/2019 stated: "I hope all is well! I'm having a flare up of some excema on my hand for the last couple of months. Stress triggered it, which has happened in the past but not recently, but it's just not going away! Would you be able to assess and possibly prescribe a cream for this or do I need to go to a dermatologist? If it's something you can take a first look at, let me know if you prefer virtual or in person. Still working on getting a clear answer from my insurance about coverage for an MRA scan this year. 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 She has another aneurysm they hope to repair soon. I also have an aunt (my mom's sister) who died from bleeding from a burst aneurysm about 20 years ago. My mom's medical team advised my sister and I notify our PCP of this medical history and develop a plan for screening for aneurysms."  MRI/MRA was recommended, looking into insurance coverage for this as stated in her last message.  PMH, PSH, SH reviewed FH reviewed/updated, mother and maternal aunt with cerebral aneurysms  Current Outpatient Medications on File Prior to Visit  Medication Sig Dispense Refill  . Cholecalciferol (VITAMIN D3 ADULT GUMMIES PO) Take 3,000 Int'l Units by mouth daily.     . Copper IUD by Intrauterine route.    . Multiple Vitamins-Minerals (MULTIVITAMIN GUMMIES ADULT) CHEW Chew 2 each by mouth daily.     No current facility-administered medications on file prior to visit.    Allergies  Allergen Reactions  . Ibuprofen Nausea And Vomiting   ROS:  No fever, chills, URI symptoms. No headaches, dizziness, neuro symptoms. Dry skin/rash as per HPI.   Observations/Objective:  Temp 98.6 F (37 C) (Oral)   Ht 5\' 6"  (1.676 m)   Wt 171 lb (77.6 kg)   LMP 03/02/2019 (Exact Date)   BMI 27.60 kg/m   Well appearing, pleasant female in good spirits. She is alert, oriented, normal mood, affect  grooming Back of left hand has a large, round, dry patch that is slightly pink. She also has dry patches between 3rd and 4th fingers on both hands, and dry patch on the proximal 3rd phalanx on R.   Assessment and Plan:  Eczema of both hands - treat with TAC 0.1%; moisturize frequently, gloves when doing dishes. soap/water preferable over alcohol-based sanitizer when possible. - Plan: triamcinolone cream (KENALOG) 0.1 %  Family history of cerebral aneurysm - MRI/MRA for screening is recommended.  She will let us know when ready  to schedule (trying to check insurance coverage/cost, has high deductible)   Proper use of topical steroids reviewed.  Reviewed s/sx infection and to f/u if worsening.   Follow Up Instructions:    I discussed the assessment and treatment plan with the patient. The patient was provided an opportunity to ask questions and all were answered. The patient agreed with the plan and demonstrated an understanding of the instructions.   The patient was advised to call back or seek an in-person evaluation if the symptoms worsen or if the condition fails to improve as anticipated.  I provided 19 minutes of non-face-to-face time during this encounter.   Lavonda JumboEve A Nthony Lefferts, MD

## 2019-03-06 ENCOUNTER — Other Ambulatory Visit: Payer: Self-pay

## 2019-03-06 ENCOUNTER — Ambulatory Visit (INDEPENDENT_AMBULATORY_CARE_PROVIDER_SITE_OTHER): Payer: 59 | Admitting: Family Medicine

## 2019-03-06 ENCOUNTER — Encounter: Payer: Self-pay | Admitting: Family Medicine

## 2019-03-06 VITALS — Temp 98.6°F | Ht 66.0 in | Wt 171.0 lb

## 2019-03-06 DIAGNOSIS — L309 Dermatitis, unspecified: Secondary | ICD-10-CM

## 2019-03-06 DIAGNOSIS — Z8249 Family history of ischemic heart disease and other diseases of the circulatory system: Secondary | ICD-10-CM | POA: Insufficient documentation

## 2019-03-06 MED ORDER — TRIAMCINOLONE ACETONIDE 0.1 % EX CREA: 1.0000 "application " | TOPICAL_CREAM | Freq: Two times a day (BID) | CUTANEOUS | 0 refills | Status: DC

## 2019-03-12 ENCOUNTER — Encounter: Payer: Self-pay | Admitting: Family Medicine

## 2019-03-12 MED ORDER — AMOXICILLIN 875 MG PO TABS: 875.0000 mg | ORAL_TABLET | Freq: Two times a day (BID) | ORAL | 0 refills | Status: DC

## 2019-04-03 DIAGNOSIS — Z975 Presence of (intrauterine) contraceptive device: Secondary | ICD-10-CM

## 2019-04-03 HISTORY — DX: Presence of (intrauterine) contraceptive device: Z97.5

## 2019-12-18 ENCOUNTER — Encounter: Payer: 59 | Admitting: Family Medicine

## 2019-12-31 ENCOUNTER — Encounter: Payer: Self-pay | Admitting: Family Medicine

## 2019-12-31 NOTE — Patient Instructions (Addendum)
HEALTH MAINTENANCE RECOMMENDATIONS:  It is recommended that you get at least 30 minutes of aerobic exercise at least 5 days/week (for weight loss, you may need as much as 60-90 minutes). This can be any activity that gets your heart rate up. This can be divided in 10-15 minute intervals if needed, but try and build up your endurance at least once a week.  Weight bearing exercise is also recommended twice weekly.  Eat a healthy diet with lots of vegetables, fruits and fiber.  "Colorful" foods have a lot of vitamins (ie green vegetables, tomatoes, red peppers, etc).  Limit sweet tea, regular sodas and alcoholic beverages, all of which has a lot of calories and sugar.  Up to 1 alcoholic drink daily may be beneficial for women (unless trying to lose weight, watch sugars).  Drink a lot of water.  Calcium recommendations are 1200-1500 mg daily (1500 mg for postmenopausal women or women without ovaries), and vitamin D 1000 IU daily.  This should be obtained from diet and/or supplements (vitamins), and calcium should not be taken all at once, but in divided doses.  Monthly self breast exams and yearly mammograms for women over the age of 72 is recommended.  Sunscreen of at least SPF 30 should be used on all sun-exposed parts of the skin when outside between the hours of 10 am and 4 pm (not just when at beach or pool, but even with exercise, golf, tennis, and yard work!)  Use a sunscreen that says "broad spectrum" so it covers both UVA and UVB rays, and make sure to reapply every 1-2 hours.  Remember to change the batteries in your smoke detectors when changing your clock times in the spring and fall. Carbon monoxide detectors are recommended for your home.  Use your seat belt every time you are in a car, and please drive safely and not be distracted with cell phones and texting while driving.  Try and go to be earlier, goal of 8 hours. Consider a nightly routine to wind down--whether that include  meditation, yoga, reading a book for enjoyment, rather than working late into the evening.  Consider grief counseling, if needed.   MyPlate from [place] is an outline of a general healthy diet based on the 2010 Dietary Guidelines for Americans, from the U.S. Department of Agriculture Scientist, research (physical sciences)). It sets guidelines for how To follow MyPlate recommendations:  Eat a wide variety of fruits and vegetables, grains, and protein foods.  Serve smaller portions and eat less food throughout the day.  Limit portion sizes to avoid overeating.  Enjoy your food.  Get at least 150 minutes of exercise every week. This is about 30 minutes each day, 5 or more days per week. It can be difficult to have every meal look like MyPlate. Think about MyPlate as eating guidelines for an entire day, rather than each individual meal. Fruits and vegetables  Make half of your plate fruits and vegetables.  Eat many different colors of fruits and vegetables each day.  For a 2,000 calorie daily food plan, eat: ? 2 cups of vegetables every day. ? 2 cups of fruit every day.  1 cup is equal to: ? 1 cup raw or cooked vegetables. ? 1 cup raw fruit. ? 1 medium-sized orange, apple, or banana. ? 1 cup 100% fruit or vegetable juice. ? 2 cups raw leafy greens, such as lettuce, spinach, or kale. ?  cup dried fruit. Grains  One fourth of your plate should be grains.  Make at least half of the grains you eat each day whole grains.  For a 2,000 calorie daily food plan, eat 6 oz of grains every day.  1 oz is equal to: ? 1 slice bread. ? 1 cup cereal. ?  cup cooked rice, cereal, or pasta. Protein  One fourth of your plate should be protein.  Eat a wide variety of protein foods, including meat, poultry, fish, eggs, beans, nuts, and tofu.  For a 2,000 calorie daily food plan, eat 5 oz of protein every day.  1 oz is equal to: ? 1 oz meat, poultry, or fish. ?  cup cooked beans. ? 1 egg. ?  oz nuts or  seeds. ? 1 Tbsp peanut butter. Dairy  Drink fat-free or low-fat (1%) milk.  Eat or drink dairy as a side to meals.  For a 2,000 calorie daily food plan, eat or drink 3 cups of dairy every day.  1 cup is equal to: ? 1 cup milk, yogurt, cottage cheese, or soy milk (soy beverage). ? 2 oz processed cheese. ? 1 oz natural cheese. Fats, oils, salt, and sugars  Only small amounts of oils are recommended.  Avoid foods that are high in calories and low in nutritional value (empty calories), like foods high in fat or added sugars.  Choose foods that are low in salt (sodium). Choose foods that have less than 140 milligrams (mg) of sodium per serving.  Drink water instead of sugary drinks. Drink enough water each day to keep your urine pale yellow. Where to find support  Work with your health care provider or a nutrition specialist (dietitian) to develop a customized eating plan that is right for you.  Download an app (mobile application) to help you track your daily food intake. Where to find more information  Go to https://www.[place].org/ for more information. Summary  MyPlate is a general guideline for healthy eating from the USDA. It is based on the 2010 Dietary Guidelines for Americans.  In general, fruits and vegetables should take up  of your plate, grains should take up  of your plate, and protein should take up  of your plate. This information is not intended to replace advice given to you by your health care provider. Make sure you discuss any questions you have with your health care provider. Document Revised: 01/03/2019 Document Reviewed: 10/31/2016 Elsevier Patient Education  2020 [employer].

## 2019-12-31 NOTE — Progress Notes (Signed)
Chief Complaint  Patient presents with  . Annual Exam    fasting annual exam, no pap and is UTD-last seen by gyn Oct. Does mention eczema but it is controlled for the most part.     Wanda Fleming is a 34 y.o. female who presents for a complete physical.  She has the following concerns:  "PMS'ing" today, emotional--recently lost her maternal uncle and her dog also died.  Maternal uncle had known cerebral aneurysm, getting scanned q6 mos, not repaired.  Died suddenly at 1.  She led his funeral 1.5 weeks ago. Dealing with grief, but otherwise overall feels that moods are okay, that she isn't depressed. Sees therapist (remote, in [place]), helping. She admits that she stays up too late, should get more sleep, and works too much at night.  This past year has been very stressful. Her mother had subarachnoid hemorrhage from a burst aneurysm; she had carotid endarterectomy, and then had coil to a second unruptured aneurysm.  She is back to about 80% now, and overall doing well.  She also has a maternal aunt who passed from a burst aneurysm about 20 years ago.  She was encouraged to get MRI/MRA for screening, and was looking into insurance coverage for this.  Her deductible is $4K, so holding off for now.  Will be changing jobs soon, and will check into this when deciding on insurance.    She was last seen in July with flare of hand eczema, treated with TAC 0.1% which helped.  Her hands have a few little dry spots, but much improved.  In the fall she got braces, had to pull a baby canine tooth, and are slowly pulling down the adult tooth. She has some dental pain periodically.  Vitamin D deficiency--level was low at 23 in 2016.  Repeat level was normal (41.6) in 07/2018, when taking MVI daily (gummy) and a vitamin D gummy daily.  Had to change vitamins (due to gummies and braces)--still doing a daily MVI gummy, changed to 2000 IU, taking every other day on average (believes this dose is lower than the  gummies she previously took.)   Immunization History  Administered Date(s) Administered  . Influenza Inj Mdck Quad Pf 06/12/2018  . Moderna SARS-COVID-2 Vaccination 11/01/2019, 11/29/2019  . Tdap 07/16/2018   Got a flu shot in October. Last Pap smear: 07/2017 normal (last we received), per GYN (will request records) Last mammogram: never Last colonoscopy: never Last DEXA: never Dentist: twice a year  Ophtho: yearly Exercise: Walks 1.5-3 miles 5x/week.  Weight training 2-3x/week.  Recently started running again, inconsistently.  Just started biking some.  Previously did triathlons, [employer]. She gets reimbursed through her job for being active.  Lipids: Lab Results  Component Value Date   CHOL 187 07/16/2018   HDL 50 07/16/2018   LDLCALC 111 (H) 07/16/2018   TRIG 128 07/16/2018   CHOLHDL 3.7 07/16/2018   She had genetic testing (BRCA and others) through her GYN, reportedly normal.   PMH, PSH, SH and FH reviewed and updated  Outpatient Encounter Medications as of 01/01/2020  Medication Sig  . Cholecalciferol (VITAMIN D3 ADULT GUMMIES PO) Take 2,000 Int'l Units by mouth daily.   . Copper IUD by Intrauterine route.  . Multiple Vitamins-Minerals (MULTIVITAMIN GUMMIES ADULT) CHEW Chew 2 each by mouth daily.  [place] Kitchen triamcinolone cream (KENALOG) 0.1 % Apply 1 application topically 2 (two) times daily. (Patient not taking: Reported on 01/01/2020)  . [DISCONTINUED] amoxicillin (AMOXIL) 875 MG tablet Take 1 tablet (875  mg total) by mouth 2 (two) times daily.   No facility-administered encounter medications on file as of 01/01/2020.   Allergies  Allergen Reactions  . Ibuprofen Nausea And Vomiting    ROS:  The patient denies anorexia, fever, headaches, vision changes, decreased hearing, ear pain, sore throat, breast concerns, chest pain, palpitations, dizziness, syncope, dyspnea on exertion, cough, swelling, nausea, vomiting, diarrhea, constipation, abdominal pain, melena, hematochezia,  indigestion/heartburn, hematuria, incontinence, dysuria, irregular menstrual cycles, vaginal discharge, odor or itch, joint pains, numbness, tingling, weakness, tremor, suspicious skin lesions, depression, abnormal bleeding/bruising, or enlarged lymph nodes. Eczema on her hands, periodically, not active currently. Menses are regular, somewhat heavy, heavier when she first got the IUD. Grief from recent losses (dog, uncle).   PHYSICAL EXAM:  BP 130/80   Pulse 80   Ht 5' 7"  (1.702 m)   Wt 181 lb 3.2 oz (82.2 kg)   LMP 12/07/2019   BMI 28.38 kg/m   124/78 on repeat by MD  Wt Readings from Last 3 Encounters:  01/01/20 181 lb 3.2 oz (82.2 kg)  03/06/19 171 lb (77.6 kg)  07/16/18 175 lb 12.8 oz (79.7 kg)    General Appearance:    Alert, cooperative, no distress, appears stated age  Head:    Normocephalic, without obvious abnormality, atraumatic  Eyes:    PERRL, conjunctiva/corneas clear, EOM's intact, fundi    benign  Ears:    Normal TM's and external ear canals  Nose:   Not examined, wearing mask due to COVID-19 pandemic  Throat:   Not examined, wearing mask due to COVID-19 pandemic  Neck:   Supple, no lymphadenopathy;  thyroid:  no enlargement/ tenderness/nodules; no carotid bruit or JVD  Back:    Spine nontender, no curvature, ROM normal, no CVA  tenderness  Lungs:     Clear to auscultation bilaterally without wheezes, rales or  ronchi; respirations unlabored  Chest Wall:    No tenderness or deformity   Heart:    Regular rate and rhythm, S1 and S2 normal, no murmur, rub   or gallop  Breast Exam:    Deferred to GYN  Abdomen:     Soft, non-tender, nondistended, normoactive bowel sounds,    no masses, no hepatosplenomegaly  Genitalia:    Deferred to GYN     Extremities:   No clubbing, cyanosis or edema  Pulses:   2+ and symmetric all extremities  Skin:   Skin color, texture, turgor normal, no rashes or lesions. A few small dry spots on her fingers. Skin exam limited, as she  opted not to change into a gown today  Lymph nodes:   Cervical, supraclavicular, and axillary nodes normal  Neurologic:   CNII-XII intact, normal strength, sensation and gait; reflexes 2+ and symmetric throughout                               Psych:   Normal mood, affect, hygiene and grooming.   PHQ-9 of 6, related to recent losses, grief Working late, not sleeping enough.   ASSESSMENT/PLAN:  Annual physical exam - Plan: TSH, VITAMIN D 25 Hydroxy (Vit-D Deficiency, Fractures), Glucose, random, Lipid panel  Vitamin D deficiency - recheck level since vitamins have changed. Adjust based on results - Plan: VITAMIN D 25 Hydroxy (Vit-D Deficiency, Fractures)  Eczema of both hands - continue to keep well moisturized, with use of TAC prn flares  Family history of cerebral aneurysm - MRI/MRA recommended for screening.  She will check into cost, insurance when she changes job, and let us know where to refer  Weight gain - counseled re: goals, risks, healthy diet, portions, exercise - Plan: TSH  BMI 28.0-28.9,adult  Grief - over very recent loss of her mat uncle and dog. Continue therapy, consider grief counseling  rec meditation/yoga, relaxation techniques, and goal of earlier bedtime.  Will fax and request pap from her GYN  TSH, D, glu lipids  Discussed monthly self breast exams and yearly mammograms after the age of 41; at least 30 minutes of aerobic activity at least 5 days/week, weight-bearing exercise at least 2x/week; proper sunscreen use reviewed; healthy diet, including goals of calcium and vitamin D intake and alcohol recommendations (less than or equal to 1 drink/day) reviewed; regular seatbelt use; changing batteries in smoke detectors, carbon monoxide detectors.  Immunization recommendations discussed--continue yearly flu shots.  Colonoscopy recommendations reviewed (age 38)  F/u 1 year, sooner prn.

## 2020-01-01 ENCOUNTER — Other Ambulatory Visit: Payer: Self-pay

## 2020-01-01 ENCOUNTER — Ambulatory Visit (INDEPENDENT_AMBULATORY_CARE_PROVIDER_SITE_OTHER): Payer: 59 | Admitting: Family Medicine

## 2020-01-01 ENCOUNTER — Encounter: Payer: Self-pay | Admitting: Family Medicine

## 2020-01-01 VITALS — BP 124/78 | HR 80 | Ht 67.0 in | Wt 181.2 lb

## 2020-01-01 DIAGNOSIS — Z0000 Encounter for general adult medical examination without abnormal findings: Secondary | ICD-10-CM | POA: Diagnosis not present

## 2020-01-01 DIAGNOSIS — F4321 Adjustment disorder with depressed mood: Secondary | ICD-10-CM

## 2020-01-01 DIAGNOSIS — R635 Abnormal weight gain: Secondary | ICD-10-CM

## 2020-01-01 DIAGNOSIS — L309 Dermatitis, unspecified: Secondary | ICD-10-CM

## 2020-01-01 DIAGNOSIS — E559 Vitamin D deficiency, unspecified: Secondary | ICD-10-CM | POA: Diagnosis not present

## 2020-01-01 DIAGNOSIS — Z8249 Family history of ischemic heart disease and other diseases of the circulatory system: Secondary | ICD-10-CM

## 2020-01-01 DIAGNOSIS — Z6828 Body mass index (BMI) 28.0-28.9, adult: Secondary | ICD-10-CM

## 2020-01-02 LAB — LIPID PANEL
Chol/HDL Ratio: 3.2 ratio (ref 0.0–4.4)
Cholesterol, Total: 171 mg/dL (ref 100–199)
HDL: 54 mg/dL (ref >39)
LDL Chol Calc (NIH): 103 mg/dL — ABNORMAL HIGH (ref 0–99)
Triglycerides: 73 mg/dL (ref 0–149)
VLDL Cholesterol Cal: 14 mg/dL (ref 5–40)

## 2020-01-02 LAB — GLUCOSE, RANDOM: Glucose: 89 mg/dL (ref 65–99)

## 2020-01-02 LAB — VITAMIN D 25 HYDROXY (VIT D DEFICIENCY, FRACTURES): Vit D, 25-Hydroxy: 40 ng/mL (ref 30.0–100.0)

## 2020-01-02 LAB — TSH: TSH: 1.76 u[IU]/mL (ref 0.450–4.500)

## 2020-01-07 ENCOUNTER — Telehealth: Payer: Self-pay | Admitting: Family Medicine

## 2020-01-07 NOTE — Telephone Encounter (Signed)
Requested records received from [facility] °

## 2020-01-09 ENCOUNTER — Encounter: Payer: Self-pay | Admitting: *Deleted

## 2020-05-20 ENCOUNTER — Encounter: Payer: Self-pay | Admitting: Family Medicine

## 2020-05-20 DIAGNOSIS — Z8249 Family history of ischemic heart disease and other diseases of the circulatory system: Secondary | ICD-10-CM

## 2020-05-21 NOTE — Telephone Encounter (Signed)
I put in the order--figuring they will get the auth and schedule.  Can you please just keep an eye on this and make sure?  Thanks!

## 2020-06-01 ENCOUNTER — Other Ambulatory Visit: Payer: Self-pay | Admitting: Family Medicine

## 2020-06-03 ENCOUNTER — Encounter: Payer: Self-pay | Admitting: Family Medicine

## 2020-06-05 ENCOUNTER — Encounter: Payer: Self-pay | Admitting: Family Medicine

## 2020-06-06 ENCOUNTER — Other Ambulatory Visit: Payer: Self-pay

## 2020-06-08 ENCOUNTER — Telehealth (INDEPENDENT_AMBULATORY_CARE_PROVIDER_SITE_OTHER): Payer: BC Managed Care – PPO | Admitting: Family Medicine

## 2020-06-08 ENCOUNTER — Encounter: Payer: Self-pay | Admitting: Family Medicine

## 2020-06-08 ENCOUNTER — Other Ambulatory Visit: Payer: Self-pay

## 2020-06-08 VITALS — Wt 175.0 lb

## 2020-06-08 DIAGNOSIS — J029 Acute pharyngitis, unspecified: Secondary | ICD-10-CM | POA: Diagnosis not present

## 2020-06-08 DIAGNOSIS — R059 Cough, unspecified: Secondary | ICD-10-CM | POA: Diagnosis not present

## 2020-06-08 MED ORDER — BENZONATATE 200 MG PO CAPS: 200.0000 mg | ORAL_CAPSULE | Freq: Three times a day (TID) | ORAL | 0 refills | Status: DC | PRN

## 2020-06-08 NOTE — Progress Notes (Signed)
Start time: 2:57 End time: 3:33  Virtual Visit via Video Note  I connected with Wanda Fleming on 06/08/20 by a video enabled telemedicine application and verified that I am speaking with the correct person using two identifiers.  Location: Patient: home Provider: office   I discussed the limitations of evaluation and management by telemedicine and the availability of in person appointments. The patient expressed understanding and agreed to proceed.  History of Present Illness:  Chief Complaint  Patient presents with  . sick    cough, sore throat, congested and post nasal drip.     Patient reports being on a plane, being masked for long periods of time, and being in [place] at [place].  She has had a sore throat and some cough.  She tried gargles and throat sprays but they tend to gag her (much more severe gag reflex since getting braces).  Through MyChart she was advised to take tylenol, try throat lozenges.  Advised that it could be from PND from allergies, to try antihistamine.  She has not yet tried these.  She denies any nasal congestion.  She does not occasional sniffling while wearing mask. She continues to have sore throat, frequent tickle and cough.  Cough is mainly dry, once coughed up some clear phlegm. Feels worse laying down. This morning her voice was hoarse, improved as she continued speaking, had some tea.  She used a nighttime tussin DM (contains doxylamine and DM, doesn't have guaifenesin), which did help her cough.  Overall she hasn't been sleeping well due to cough. Did neti-pot this morning and 2 nights ago--didn't get much out. Doesn't like sudafed. She has looked at her throat, notes it is just slightly red (nothing like when she had strep in the past). She denies any heartburn/reflux.  Adopted a dog this weekend. Had another dog, lost 6 months ago No known dog allergies, and symptoms started prior to getting this dog.  PMH, PSH, SH reviewed  Outpatient Encounter  Medications as of 06/08/2020  Medication Sig Note  . Cholecalciferol (VITAMIN D3 ADULT GUMMIES PO) Take 2,000 Int'l Units by mouth daily.  01/01/2020: Now taking tablets, not gummies, 2000 IU, somewhat sporadic, averaging every other day  . Copper IUD by Intrauterine route.   . Multiple Vitamins-Minerals (MULTIVITAMIN GUMMIES ADULT) CHEW Chew 2 each by mouth daily.   . benzonatate (TESSALON) 200 MG capsule Take 1 capsule (200 mg total) by mouth 3 (three) times daily as needed for cough.   . triamcinolone cream (KENALOG) 0.1 % Apply 1 application topically 2 (two) times daily. (Patient not taking: Reported on 01/01/2020)    No facility-administered encounter medications on file as of 06/08/2020.   NOT taking benzonatate prior to today's visit  Allergies  Allergen Reactions  . Ibuprofen Nausea And Vomiting   ROS: no fever, chills, headaches, dizziness, heartburn, nausea, vomiting.  +sore throat, cough, and fatigue from poor sleep (due to cough) per HPI.  No heartburn, bowel changes, or other concerns.   Observations/Objective:  Wt 175 lb (79.4 kg)   BMI 27.41 kg/m   Well-appearing female, in no distress. Periodically sipping on water. No coughing, though appears at times to have a slight tickle/catch (and would sip water). She is speaking comfortably, in no distress. Doesn't sound congested, no sniffling.   Assessment and Plan:  Cough - suspect related to PND, likely from allergies - Plan: benzonatate (TESSALON) 200 MG capsule  Sore throat - suspect related to PND, likely from allergies. Add guaifenesin, antihistamine, consider  adding Flonase if inadequate relief.  DM if tessalon not helpful. Add antihistamine  Consider using Flonase if only partial response to the antihistamine. Discussed DDx also includes reflux.  Follow Up Instructions:    I discussed the assessment and treatment plan with the patient. The patient was provided an opportunity to ask questions and all were  answered. The patient agreed with the plan and demonstrated an understanding of the instructions.   The patient was advised to call back or seek an in-person evaluation if the symptoms worsen or if the condition fails to improve as anticipated.  I provided 36 minutes of video face-to-face time during this encounter.  Additional time (5-10 min) was spent in chart review and documentation.   Lavonda Jumbo, MD

## 2020-06-08 NOTE — Patient Instructions (Addendum)
Continue to drink plenty of water. Today we discussed starting an antihistamine (like claritin, zyrtec or allegra), once daily. We also discussed adding in guaifenesin (the ingredient found in Mucinex, robitussin). We sent in a prescription for benzonatate--a pill that is nonsedating and should help for the tickle-type of cough that you're having. If it isn't helping enough (along with the guaifenesin and antihistamine), you can also use the DM-containing medication that you were taking at night.  If you only get partial relief, you can try adding Flonase (nasal steroid), as we discussed.  If symptoms persist/worsen, let us know.  If mucus remains clear, no sinus pain or fever, might be worth coming in for a visit for me to do an in-person exam (looking at nose, abdomen--we briefly mentioned that reflux can sometimes cause cough and sore throat--and to try pepcid or prilosec if nothing else helps).  If you develop fever, discolored mucus, etc then an antibiotic may be needed.  I hope you feel better soon! Enjoy Tucker--super cute!!!

## 2020-06-16 ENCOUNTER — Encounter: Payer: Self-pay | Admitting: Family Medicine

## 2020-06-16 ENCOUNTER — Telehealth: Payer: Self-pay

## 2020-06-16 NOTE — Telephone Encounter (Signed)
Wanda Fleming please disregard the message earlier . I routed it to Dr. Cheri Kearns because it is her pt. KH

## 2020-06-23 ENCOUNTER — Other Ambulatory Visit: Payer: Self-pay

## 2020-06-23 ENCOUNTER — Ambulatory Visit
Admission: RE | Admit: 2020-06-23 | Discharge: 2020-06-23 | Disposition: A | Discharge status: Home / Self Care | Payer: BC Managed Care – PPO | Source: Ambulatory Visit | Attending: Family Medicine | Admitting: Family Medicine

## 2020-06-23 DIAGNOSIS — Z8249 Family history of ischemic heart disease and other diseases of the circulatory system: Secondary | ICD-10-CM

## 2020-06-23 DIAGNOSIS — I671 Cerebral aneurysm, nonruptured: Secondary | ICD-10-CM | POA: Diagnosis not present

## 2020-06-24 ENCOUNTER — Encounter: Payer: Self-pay | Admitting: Family Medicine

## 2020-07-08 ENCOUNTER — Encounter: Payer: Self-pay | Admitting: Family Medicine

## 2020-07-20 DIAGNOSIS — Z01419 Encounter for gynecological examination (general) (routine) without abnormal findings: Secondary | ICD-10-CM | POA: Diagnosis not present

## 2020-07-20 DIAGNOSIS — Z6828 Body mass index (BMI) 28.0-28.9, adult: Secondary | ICD-10-CM | POA: Diagnosis not present

## 2020-07-20 DIAGNOSIS — Z3202 Encounter for pregnancy test, result negative: Secondary | ICD-10-CM | POA: Diagnosis not present

## 2020-08-04 DIAGNOSIS — R1032 Left lower quadrant pain: Secondary | ICD-10-CM | POA: Diagnosis not present

## 2020-08-04 DIAGNOSIS — R1031 Right lower quadrant pain: Secondary | ICD-10-CM | POA: Diagnosis not present

## 2021-01-04 ENCOUNTER — Encounter: Payer: 59 | Admitting: Family Medicine

## 2021-01-19 NOTE — Progress Notes (Signed)
Chief Complaint  Patient presents with   Annual Exam    Fasting annual exam no pap-goes to [facility] and is UTD. 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     Wanda Fleming is a 35 y.o. female who presents for a complete physical.  She has the following concerns:  Recently moved in with a friend, after landlord sold her apartment out from under her.  She had 30d to move out, much of her stuff is in storage, not well organized.  Hasn't been able to swim, bike or go to the Y, without having to buy new things.  Continues to be active, walking the dog, which has helped with her moods.  Boyfriend of house-mate moved in, so tends to stay in her room a lot.  She is concerned about the veins on her legs (referred to as varicose veins).  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  They are not symptomatic.  Parents have similar issues (none with bulging/varicose veins).  Hand eczema, treated with TAC 0.1% prn with good results. No recent flare.  H/o Vitamin D deficiency--Last level was normal at 40 in 12/2019, when taking MVI gummy daily and 2000 IU of D3 averaging every other day.  Level was low at 23 in 2016.  She is currently taking Vitamin D3 2000 IU--had been taking daily until her recent move, a little more sporadic just in the last couple of months. She ran out of MVI just a few weeks ago, had been taking regularly.  Family h/o cerebral aneurysms. She underwent screening MRA in 06/2020 which did not show any aneurysm.  Immunization History  Administered Date(s) Administered   Influenza Inj Mdck Quad Pf 06/12/2018   Influenza,inj,Quad PF,6+ Mos 05/24/2019   Influenza-Unspecified 05/03/2020   Moderna Sars-Covid-2 Vaccination 11/01/2019, 11/29/2019, 07/17/2020   Tdap 07/16/2018   Last Pap smear: 07/2017 normal (last we received), per GYN (will request records). Last saw GYN in 07/2020 Last  mammogram: never. She had negative BRCA gene testing in past due to FHx Last colonoscopy: never Last DEXA: never Dentist: twice a year  Ophtho: yearly Exercise: Walks 1-2 miles/day with her dog, every day.  Sporadic yoga.  Not much high intensity cardio.  Wants to swim, but much of her stuff is in a storage unit in current living situation. Not currently getting weight-bearing exercise (prev did 2-3x/week).    Lipids: She sent [place] copies of labs done through [facility] in [place] from 05/2020--TC 182, HDL 54, LDL 106, TG 127, ratio 3.4.  She had normal CBC (H/H 12/38.2) but low ferritin, 10.6. Normal vit D-OH, 43.  Normal glu (90), A1c (4.9), normal Ca, Mg, CK and folate.  She recalls getting fingerstick hg checked at GYN.  She has been taking iron supplement during her cycles x 6 months.  Lab Results  Component Value Date   CHOL 171 01/01/2020   HDL 54 01/01/2020   LDLCALC 103 (H) 01/01/2020   TRIG 73 01/01/2020   CHOLHDL 3.2 01/01/2020   She had genetic testing (BRCA and others) through her GYN, reportedly normal.   PMH, PSH, SH and FH reviewed and updated  Outpatient Encounter Medications as of 01/21/2021  Medication Sig Note   Cholecalciferol (VITAMIN D3 ADULT GUMMIES PO) Take 2,000 Int'l Units by mouth daily.  01/21/2021: Has been taking daily, though somewhat more inconsistent just for the last  couple of months   Copper IUD by Intrauterine route.    fluticasone (FLONASE) 50 MCG/ACT nasal spray Place 2 sprays into both nostrils daily.    NON FORMULARY  01/21/2021: CBD OIL   Multiple Vitamins-Minerals (MULTIVITAMIN GUMMIES ADULT) CHEW Chew 2 each by mouth daily. (Patient not taking: Reported on 01/21/2021) 01/21/2021: Ran out a few weeks ago   triamcinolone cream (KENALOG) 0.1 % Apply 1 application topically 2 (two) times daily. (Patient not taking: No sig reported)    [DISCONTINUED] benzonatate (TESSALON) 200 MG capsule Take 1 capsule (200 mg total) by mouth 3 (three) times daily as needed for  cough.    No facility-administered encounter medications on file as of 01/21/2021.   Allergies  Allergen Reactions   Ibuprofen Nausea And Vomiting    ROS:  The patient denies anorexia, fever, headaches, vision changes, decreased hearing, ear pain, sore throat, breast concerns, chest pain, palpitations, dizziness, syncope, dyspnea on exertion, cough, swelling, nausea, vomiting, diarrhea, constipation, abdominal pain, melena, hematochezia, indigestion/heartburn, hematuria, incontinence, dysuria, irregular menstrual cycles, vaginal discharge, odor or itch, joint pains, numbness, tingling, weakness, tremor, suspicious skin lesions, depression, abnormal bleeding/bruising, or enlarged lymph nodes. Eczema on her hands, periodically, not active currently. Menses are regular, somewhat heavy, since having IUD. Taking iron during cycles now.  Has some cramps x 2d.  Some situational anxiety (related to recent changes at home; has support, walking with dog helps). Prominent veins on legs.    PHYSICAL EXAM:  BP 120/70   Pulse 76   Ht 5' 7"  (1.702 m)   Wt 179 lb 9.6 oz (81.5 kg)   LMP 12/27/2020 (Exact Date)   BMI 28.13 kg/m   Wt Readings from Last 3 Encounters:  01/21/21 179 lb 9.6 oz (81.5 kg)  06/08/20 175 lb (79.4 kg)  01/01/20 181 lb 3.2 oz (82.2 kg)    General Appearance:    Alert, cooperative, no distress, appears stated age  Head:    Normocephalic, without obvious abnormality, atraumatic  Eyes:    PERRL, conjunctiva/corneas clear, EOM's intact, fundi    benign  Ears:    Normal TM's and external ear canals  Nose:   Not examined, wearing mask due to COVID-19 pandemic  Throat:   Not examined, wearing mask due to COVID-19 pandemic  Neck:   Supple, no lymphadenopathy;  thyroid:  no enlargement/ tenderness/nodules; no carotid bruit or JVD  Back:    Spine nontender, no curvature, ROM normal, no CVA  tenderness  Lungs:     Clear to auscultation bilaterally without wheezes, rales or  ronchi;  respirations unlabored  Chest Wall:    No tenderness or deformity   Heart:    Regular rate and rhythm, S1 and S2 normal, no murmur, rub   or gallop  Breast Exam:    Deferred to GYN  Abdomen:     Soft, non-tender, nondistended, normoactive bowel sounds,    no masses, no hepatosplenomegaly  Genitalia:    Deferred to GYN       Extremities:   No clubbing, cyanosis or edema  Pulses:   2+ and symmetric all extremities  Skin:   Skin color, texture, turgor normal, no rashes or lesions. Skin exam limited, as she opted not to change into a gown today. She did have legs out for exam--superficial veins, cluster that is raised (of superficial veins) at L posterior leg.  No varicosities noted, nontender, no bruising.  Lymph nodes:   Cervical, supraclavicular, and axillary nodes normal  Neurologic:  Normal strength, sensation and gait; reflexes 2+ and symmetric throughout                               Psych:   Normal mood, affect, hygiene and grooming.   ASSESSMENT/PLAN:  Annual physical exam  GYN notes/results from 07/2020 will be requested  No labs done today--since GYN rechecked Hg. Will need Hep C screen when labs drawn next.   Discussed monthly self breast exams and yearly mammograms after the age of 9; at least 30 minutes of aerobic activity at least 5 days/week, weight-bearing exercise at least 2x/week; proper sunscreen use reviewed; healthy diet, including goals of calcium and vitamin D intake and alcohol recommendations (less than or equal to 1 drink/day) reviewed; regular seatbelt use; changing batteries in smoke detectors, carbon monoxide detectors.  Immunization recommendations discussed--continue yearly flu shots.  Colonoscopy recommendations reviewed (age 20)  F/u 1 year, sooner prn.

## 2021-01-19 NOTE — Patient Instructions (Signed)

## 2021-01-21 ENCOUNTER — Ambulatory Visit (INDEPENDENT_AMBULATORY_CARE_PROVIDER_SITE_OTHER): Payer: BC Managed Care – PPO | Admitting: Family Medicine

## 2021-01-21 ENCOUNTER — Other Ambulatory Visit: Payer: Self-pay

## 2021-01-21 ENCOUNTER — Encounter: Payer: Self-pay | Admitting: Family Medicine

## 2021-01-21 VITALS — BP 120/70 | HR 76 | Ht 67.0 in | Wt 179.6 lb

## 2021-01-21 DIAGNOSIS — Z0000 Encounter for general adult medical examination without abnormal findings: Secondary | ICD-10-CM

## 2021-05-12 ENCOUNTER — Encounter: Payer: Self-pay | Admitting: *Deleted

## 2021-08-18 ENCOUNTER — Encounter: Payer: Self-pay | Admitting: Family Medicine

## 2021-09-07 DIAGNOSIS — Z6828 Body mass index (BMI) 28.0-28.9, adult: Secondary | ICD-10-CM | POA: Diagnosis not present

## 2021-09-07 DIAGNOSIS — Z01419 Encounter for gynecological examination (general) (routine) without abnormal findings: Secondary | ICD-10-CM | POA: Diagnosis not present

## 2021-09-09 IMAGING — MR MR MRA HEAD W/O CM
1 series · 24 of 48 positions shown · non-contrast
Comparison: None.

CLINICAL DATA: Cerebral aneurysm screening.  Genetic risk.

EXAM:
MRA HEAD WITHOUT CONTRAST
TECHNIQUE: Angiographic images of the Circle of Willis were obtained using MRA
technique without intravenous contrast.

[Series 2: tof_3d_multi-slab new · axial · 0.7mm · 0.35mm/px · z∈[-37,+47]mm · 24 of 130 slices shown]
[im 1/130]
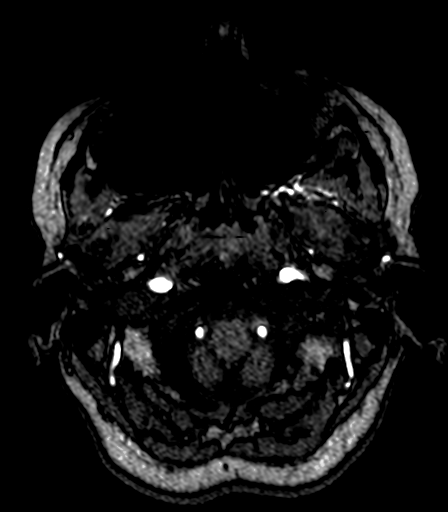
[im 3/130]
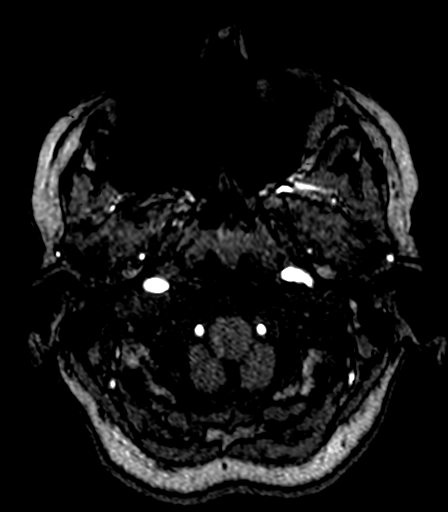
[im 6/130]
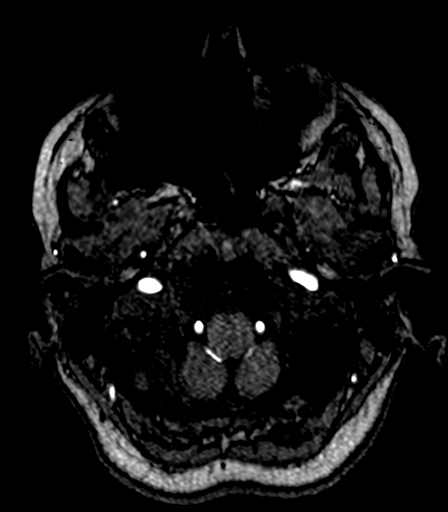
[im 9/130]
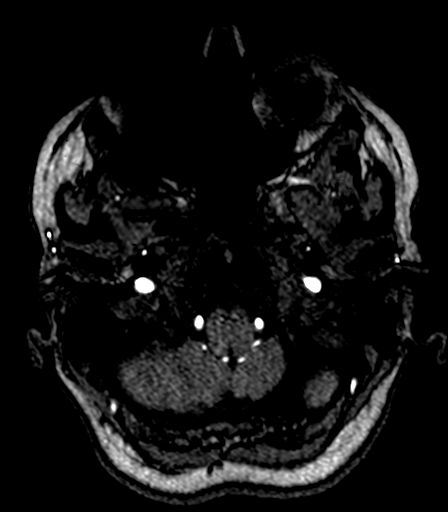
[im 11/130]
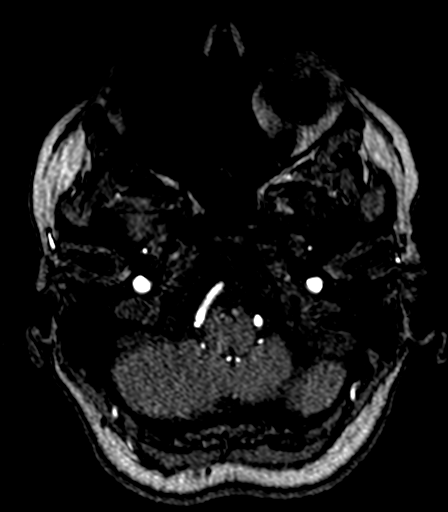
[im 14/130]
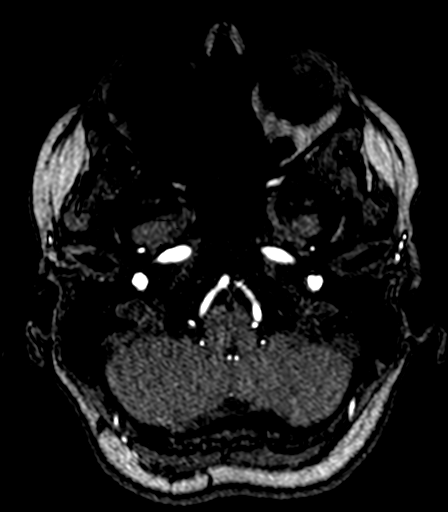
[im 17/130]
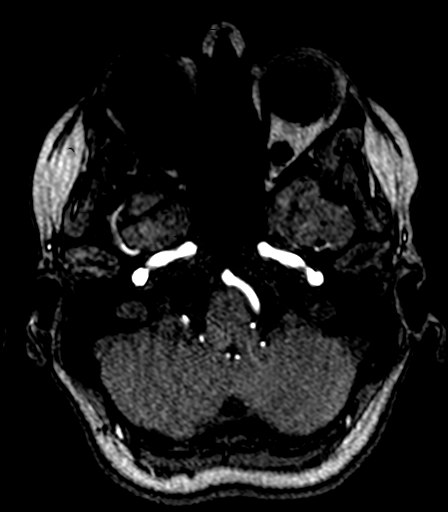
[im 20/130]
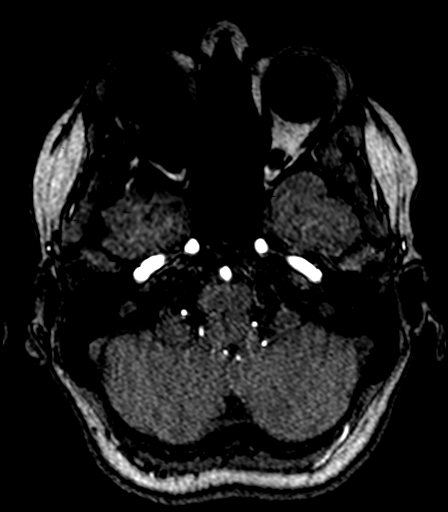
[im 22/130]
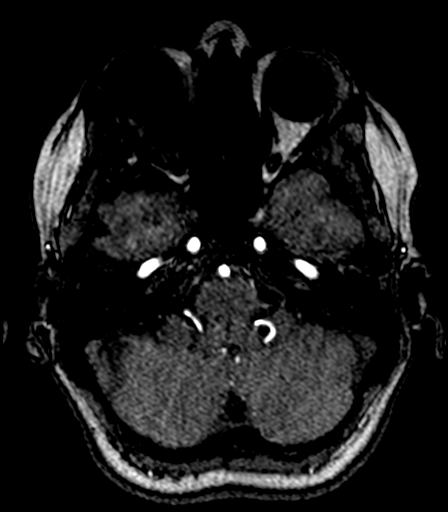
[im 25/130]
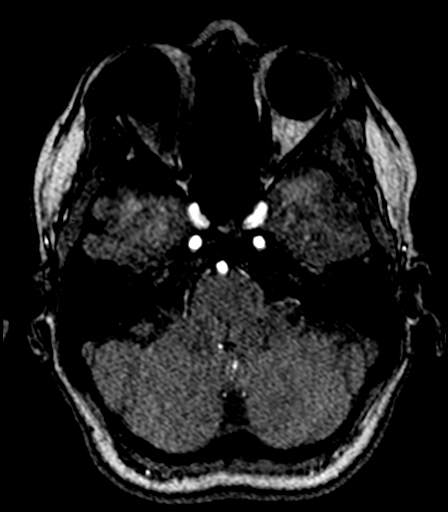
[im 28/130]
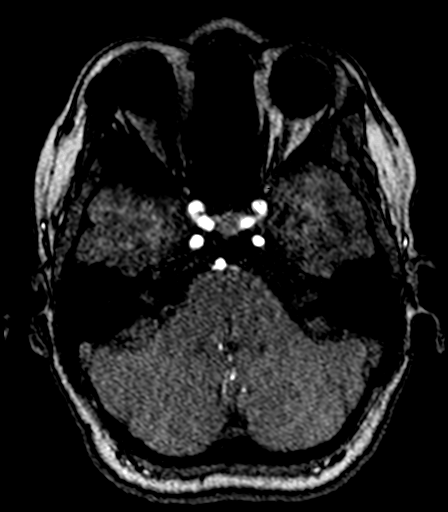
[im 31/130]
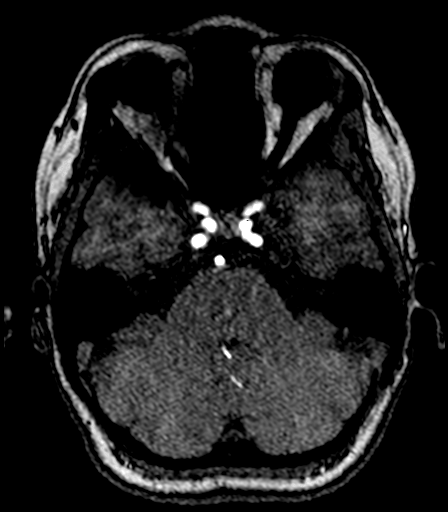
[im 33/130]
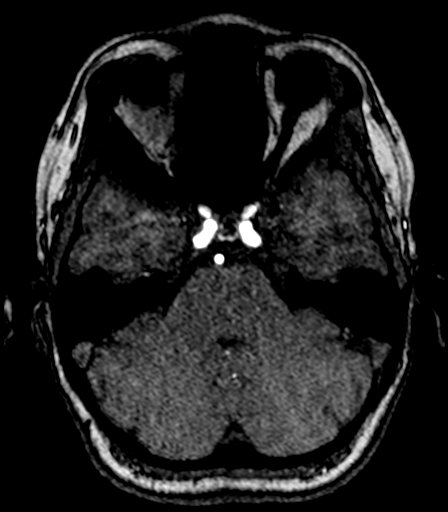
[im 36/130]
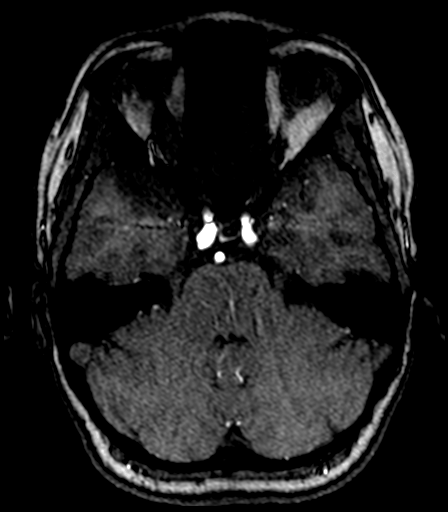
[im 39/130]
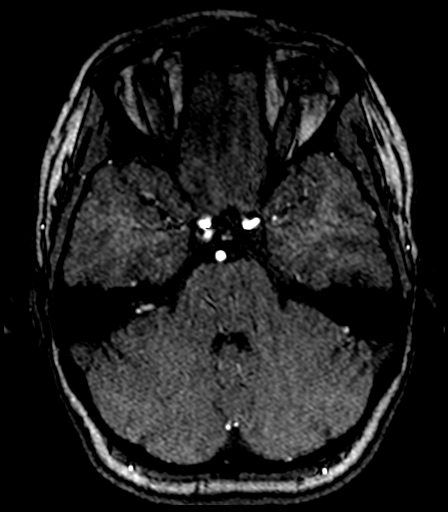
[im 42/130]
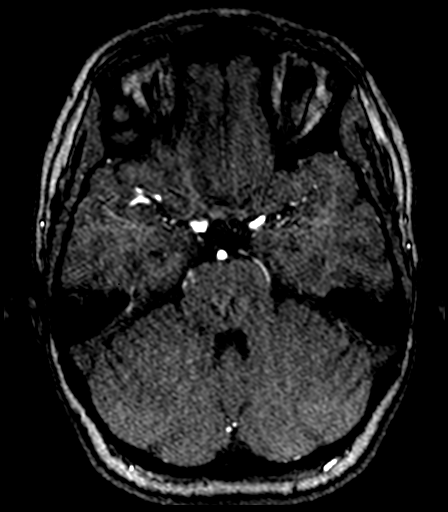
[im 44/130]
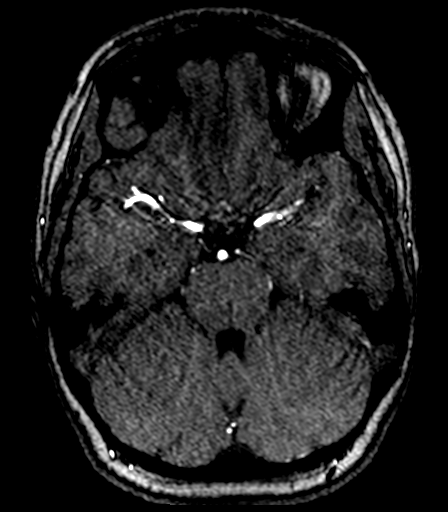
[im 58/130]
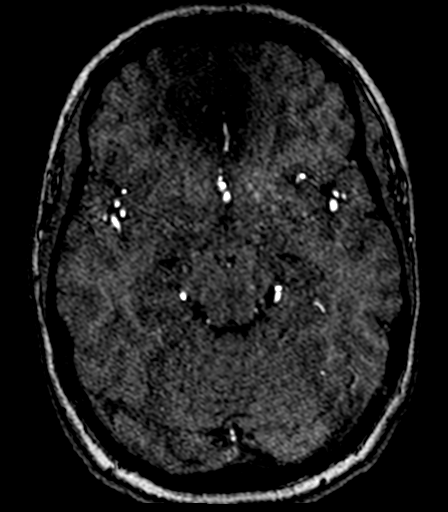
[im 66/130]
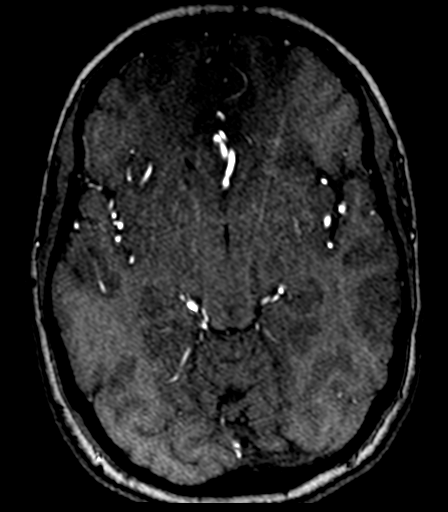
[im 75/130]
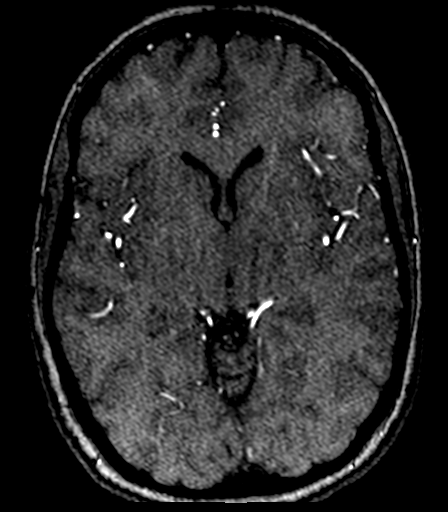
[im 91/130]
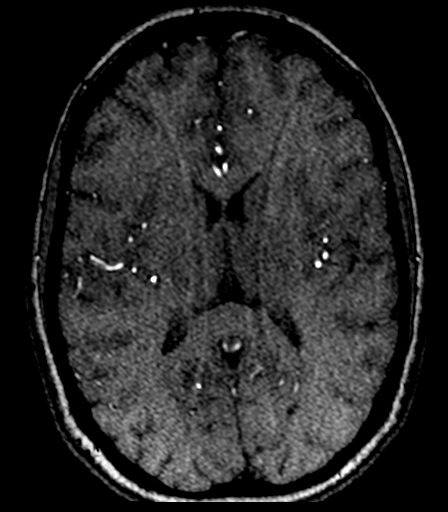
[im 108/130]
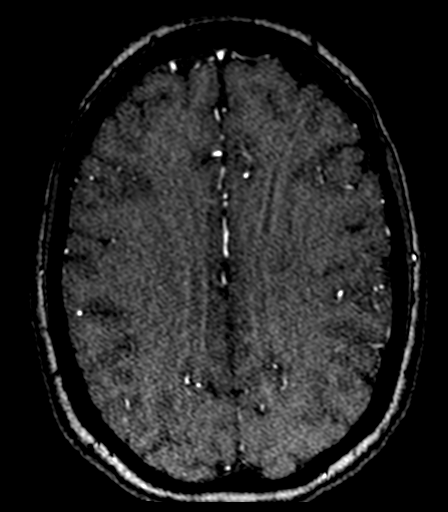
[im 110/130]
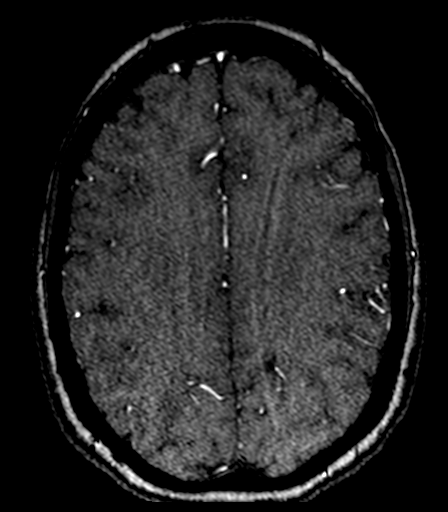
[im 124/130]
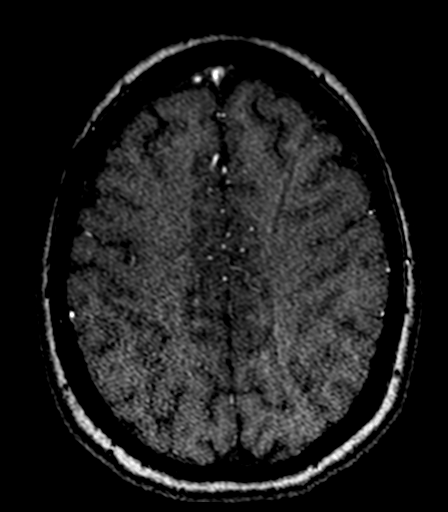

[24 of 48 positions shown; findings below may reference images not displayed]

FINDINGS: Unremarkable intracranial branching pattern. No stenosis, beading,
aneurysm, or evidence of vascular malformation.
IMPRESSION: Negative intracranial MRA.

## 2021-11-25 DIAGNOSIS — F411 Generalized anxiety disorder: Secondary | ICD-10-CM | POA: Diagnosis not present

## 2021-11-29 ENCOUNTER — Encounter: Payer: Self-pay | Admitting: Family Medicine

## 2022-01-20 DIAGNOSIS — F411 Generalized anxiety disorder: Secondary | ICD-10-CM | POA: Diagnosis not present

## 2022-01-23 NOTE — Patient Instructions (Signed)

## 2022-01-23 NOTE — Progress Notes (Unsigned)
No chief complaint on file.   Wanda Fleming is a 36 y.o. female who presents for a complete physical.  She saw her GYN in 08/2021.  No records were received.  Through [facility], I see that Hgb was 11.4 and urine dipstick negative for glucose.  She previously reported taking iron supplement during her cycles. UPDATE   Last year--UPDATE Recently moved in with a friend, after landlord sold her apartment out from under her.  She had 30d to move out, much of her stuff is in storage, not well organized.  Hasn't been able to swim, bike or go to the Y, without having to buy new things.  Continues to be active, walking the dog, which has helped with her moods.  Boyfriend of house-mate moved in, so tends to stay in her room a lot.   Hand eczema, treated with TAC 0.1% prn with good results. No recent flare.  H/o Vitamin D deficiency--Last level was normal at 40 in 12/2019, when taking MVI gummy daily and 2000 IU of D3 averaging every other day.  Level was low at 23 in 2016.  She is currently taking   Family h/o cerebral aneurysms. She underwent screening MRA in 06/2020 which did not show any aneurysm.  Immunization History  Administered Date(s) Administered   Influenza Inj Mdck Quad Pf 06/12/2018   Influenza,inj,Quad PF,6+ Mos 05/24/2019   Influenza-Unspecified 05/03/2020   Moderna Sars-Covid-2 Vaccination 11/01/2019, 11/29/2019, 07/17/2020   Tdap 07/16/2018   Last Pap smear: 07/2017 normal (last we received), per GYN.  Last visit 08/2021, haven't received records in the last few years. Last mammogram: never. She had negative BRCA gene testing in past due to FHx Last colonoscopy: never Last DEXA: never Dentist: twice a year  Ophtho: yearly Exercise:  Walks 1-2 miles/day with her dog, every day.  Sporadic yoga.  Not much high intensity cardio.   Swim ? Not currently getting weight-bearing exercise (prev did 2-3x/week).    Lipids: She sent [place] copies of labs done through [facility] in [place]  from 05/2020--TC 182, HDL 54, LDL 106, TG 127, ratio 3.4.  She had normal CBC (H/H 12/38.2) but low ferritin, 10.6. Normal vit D-OH, 43.  Normal glu (90), A1c (4.9), normal Ca, Mg, CK and folate.  Lab Results  Component Value Date   CHOL 171 01/01/2020   HDL 54 01/01/2020   LDLCALC 103 (H) 01/01/2020   TRIG 73 01/01/2020   CHOLHDL 3.2 01/01/2020   She had genetic testing (BRCA and others) through her GYN, reportedly normal.   PMH, PSH, SH and FH reviewed and updated    ROS:  The patient denies anorexia, fever, headaches, vision changes, decreased hearing, ear pain, sore throat, breast concerns, chest pain, palpitations, dizziness, syncope, dyspnea on exertion, cough, swelling, nausea, vomiting, diarrhea, constipation, abdominal pain, melena, hematochezia, indigestion/heartburn, hematuria, incontinence, dysuria, irregular menstrual cycles, vaginal discharge, odor or itch, joint pains, numbness, tingling, weakness, tremor, suspicious skin lesions, depression, abnormal bleeding/bruising, or enlarged lymph nodes. Eczema on her hands, periodically, not active currently. Menses are regular, somewhat heavy, since having IUD. Taking iron during cycles now.  Has some cramps x 2d.  Some situational anxiety (related to recent changes at home; has support, walking with dog helps). Prominent veins on legs.    PHYSICAL EXAM:  There were no vitals taken for this visit.  Wt Readings from Last 3 Encounters:  01/21/21 179 lb 9.6 oz (81.5 kg)  06/08/20 175 lb (79.4 kg)  01/01/20 181 lb 3.2 oz (82.2 kg)  General Appearance:    Alert, cooperative, no distress, appears stated age  Head:    Normocephalic, without obvious abnormality, atraumatic  Eyes:    PERRL, conjunctiva/corneas clear, EOM's intact, fundi    benign  Ears:    Normal TM's and external ear canals  Nose:   Normal, no drainage or sinus tenderness  Throat:   Normal mucosa, no lesions or erythema  Neck:   Supple, no lymphadenopathy;   thyroid:  no enlargement/ tenderness/nodules; no carotid bruit or JVD  Back:    Spine nontender, no curvature, ROM normal, no CVA  tenderness  Lungs:     Clear to auscultation bilaterally without wheezes, rales or  ronchi; respirations unlabored  Chest Wall:    No tenderness or deformity   Heart:    Regular rate and rhythm, S1 and S2 normal, no murmur, rub   or gallop  Breast Exam:    Deferred to GYN  Abdomen:     Soft, non-tender, nondistended, normoactive bowel sounds,    no masses, no hepatosplenomegaly  Genitalia:    Deferred to GYN       Extremities:   No clubbing, cyanosis or edema  Pulses:   2+ and symmetric all extremities  Skin:   Skin color, texture, turgor normal, no rashes or lesions. Skin exam limited, as she opted not to change into a gown today.  Legs: superficial veins, cluster that is raised (of superficial veins) at L posterior leg.  No varicosities noted, nontender, no bruising.  Lymph nodes:   Cervical, supraclavicular nodes normal  Neurologic:   Normal strength, sensation and gait; reflexes 2+ and symmetric throughout                               Psych:   Normal mood, affect, hygiene and grooming.   Update veins LLE and if in gown  ASSESSMENT/PLAN:   Enter flu shot date Enter COVID booster if she had any since (?bivalent?)  PLEASE GET RECORDS FROM DR MORRIS, HER OB-GYN--never got from last year.  Had visits 07/2020, 08/2021. Would like notes, pap smears, and any results--I believe she had pelvic US in 2021?   Consider CBC, ferritin. Will need Hep C screen when labs drawn next.   Discussed monthly self breast exams and yearly mammograms after the age of 35; at least 30 minutes of aerobic activity at least 5 days/week, weight-bearing exercise at least 2x/week; proper sunscreen use reviewed; healthy diet, including goals of calcium and vitamin D intake and alcohol recommendations (less than or equal to 1 drink/day) reviewed; regular seatbelt use; changing batteries  in smoke detectors, carbon monoxide detectors.  Immunization recommendations discussed--continue yearly flu shots. Updated COVID booster recommended in the Fall, when available.  Colonoscopy recommendations reviewed (age 20)  F/u 1 year, sooner prn.

## 2022-01-24 ENCOUNTER — Encounter: Payer: Self-pay | Admitting: Family Medicine

## 2022-01-24 ENCOUNTER — Ambulatory Visit (INDEPENDENT_AMBULATORY_CARE_PROVIDER_SITE_OTHER): Payer: BC Managed Care – PPO | Admitting: Family Medicine

## 2022-01-24 VITALS — BP 120/70 | HR 78 | Ht 67.0 in | Wt 176.8 lb

## 2022-01-24 DIAGNOSIS — Z1159 Encounter for screening for other viral diseases: Secondary | ICD-10-CM | POA: Diagnosis not present

## 2022-01-24 DIAGNOSIS — R5383 Other fatigue: Secondary | ICD-10-CM | POA: Diagnosis not present

## 2022-01-24 DIAGNOSIS — D509 Iron deficiency anemia, unspecified: Secondary | ICD-10-CM | POA: Diagnosis not present

## 2022-01-24 DIAGNOSIS — Z0000 Encounter for general adult medical examination without abnormal findings: Secondary | ICD-10-CM | POA: Diagnosis not present

## 2022-01-24 DIAGNOSIS — E559 Vitamin D deficiency, unspecified: Secondary | ICD-10-CM | POA: Diagnosis not present

## 2022-01-25 ENCOUNTER — Encounter: Payer: Self-pay | Admitting: Family Medicine

## 2022-01-25 LAB — CBC WITH DIFFERENTIAL/PLATELET
Basophils Absolute: 0 10*3/uL (ref 0.0–0.2)
Basos: 1 %
EOS (ABSOLUTE): 0.2 10*3/uL (ref 0.0–0.4)
Eos: 3 %
Hematocrit: 39.2 % (ref 34.0–46.6)
Hemoglobin: 12.7 g/dL (ref 11.1–15.9)
Immature Grans (Abs): 0 10*3/uL (ref 0.0–0.1)
Immature Granulocytes: 0 %
Lymphocytes Absolute: 2.2 10*3/uL (ref 0.7–3.1)
Lymphs: 31 %
MCH: 27.3 pg (ref 26.6–33.0)
MCHC: 32.4 g/dL (ref 31.5–35.7)
MCV: 84 fL (ref 79–97)
Monocytes Absolute: 0.5 10*3/uL (ref 0.1–0.9)
Monocytes: 7 %
Neutrophils Absolute: 4.1 10*3/uL (ref 1.4–7.0)
Neutrophils: 58 %
Platelets: 306 10*3/uL (ref 150–450)
RBC: 4.66 x10E6/uL (ref 3.77–5.28)
RDW: 13.7 % (ref 11.7–15.4)
WBC: 7.1 10*3/uL (ref 3.4–10.8)

## 2022-01-25 LAB — COMPREHENSIVE METABOLIC PANEL
ALT: 11 IU/L (ref 0–32)
AST: 16 IU/L (ref 0–40)
Albumin/Globulin Ratio: 1.6 (ref 1.2–2.2)
Albumin: 4.7 g/dL (ref 3.8–4.8)
Alkaline Phosphatase: 70 IU/L (ref 44–121)
BUN/Creatinine Ratio: 16 (ref 9–23)
BUN: 12 mg/dL (ref 6–20)
Bilirubin Total: 0.3 mg/dL (ref 0.0–1.2)
CO2: 22 mmol/L (ref 20–29)
Calcium: 9.7 mg/dL (ref 8.7–10.2)
Chloride: 102 mmol/L (ref 96–106)
Creatinine, Ser: 0.73 mg/dL (ref 0.57–1.00)
Globulin, Total: 3 g/dL (ref 1.5–4.5)
Glucose: 96 mg/dL (ref 70–99)
Potassium: 4.9 mmol/L (ref 3.5–5.2)
Sodium: 139 mmol/L (ref 134–144)
Total Protein: 7.7 g/dL (ref 6.0–8.5)
eGFR: 111 mL/min/{1.73_m2} (ref >59)

## 2022-01-25 LAB — LIPID PANEL
Chol/HDL Ratio: 3.7 ratio (ref 0.0–4.4)
Cholesterol, Total: 216 mg/dL — ABNORMAL HIGH (ref 100–199)
HDL: 58 mg/dL (ref >39)
LDL Chol Calc (NIH): 140 mg/dL — ABNORMAL HIGH (ref 0–99)
Triglycerides: 102 mg/dL (ref 0–149)
VLDL Cholesterol Cal: 18 mg/dL (ref 5–40)

## 2022-01-25 LAB — FERRITIN: Ferritin: 16 ng/mL (ref 15–150)

## 2022-01-25 LAB — TSH: TSH: 2.2 u[IU]/mL (ref 0.450–4.500)

## 2022-01-25 LAB — HEPATITIS C ANTIBODY: Hep C Virus Ab: NONREACTIVE

## 2022-01-25 LAB — VITAMIN D 25 HYDROXY (VIT D DEFICIENCY, FRACTURES): Vit D, 25-Hydroxy: 37.5 ng/mL (ref 30.0–100.0)

## 2022-02-09 ENCOUNTER — Encounter: Payer: Self-pay | Admitting: *Deleted

## 2022-02-14 ENCOUNTER — Encounter: Payer: Self-pay | Admitting: Family Medicine

## 2022-02-22 DIAGNOSIS — F411 Generalized anxiety disorder: Secondary | ICD-10-CM | POA: Diagnosis not present

## 2022-03-17 DIAGNOSIS — F411 Generalized anxiety disorder: Secondary | ICD-10-CM | POA: Diagnosis not present

## 2022-04-08 DIAGNOSIS — F411 Generalized anxiety disorder: Secondary | ICD-10-CM | POA: Diagnosis not present

## 2022-04-20 ENCOUNTER — Encounter: Payer: Self-pay | Admitting: Internal Medicine

## 2022-05-04 DIAGNOSIS — Z20818 Contact with and (suspected) exposure to other bacterial communicable diseases: Secondary | ICD-10-CM | POA: Diagnosis not present

## 2022-05-04 DIAGNOSIS — J020 Streptococcal pharyngitis: Secondary | ICD-10-CM | POA: Diagnosis not present

## 2022-05-24 ENCOUNTER — Encounter: Payer: Self-pay | Admitting: Internal Medicine

## 2022-06-16 DIAGNOSIS — F411 Generalized anxiety disorder: Secondary | ICD-10-CM | POA: Diagnosis not present

## 2022-08-18 ENCOUNTER — Encounter: Payer: Self-pay | Admitting: Family Medicine

## 2022-08-18 DIAGNOSIS — E785 Hyperlipidemia, unspecified: Secondary | ICD-10-CM

## 2022-08-18 DIAGNOSIS — Z131 Encounter for screening for diabetes mellitus: Secondary | ICD-10-CM

## 2022-08-24 NOTE — Telephone Encounter (Signed)
Pt would like fasting glucose and A1c done. Is this ok?   What is the reason for these tests?

## 2022-09-02 DIAGNOSIS — F411 Generalized anxiety disorder: Secondary | ICD-10-CM | POA: Diagnosis not present

## 2022-09-06 ENCOUNTER — Other Ambulatory Visit: Payer: BC Managed Care – PPO

## 2022-09-06 DIAGNOSIS — E785 Hyperlipidemia, unspecified: Secondary | ICD-10-CM

## 2022-09-06 DIAGNOSIS — Z131 Encounter for screening for diabetes mellitus: Secondary | ICD-10-CM

## 2022-09-07 LAB — LIPID PANEL
Chol/HDL Ratio: 3.3 ratio (ref 0.0–4.4)
Cholesterol, Total: 177 mg/dL (ref 100–199)
HDL: 54 mg/dL (ref >39)
LDL Chol Calc (NIH): 111 mg/dL — ABNORMAL HIGH (ref 0–99)
Triglycerides: 61 mg/dL (ref 0–149)
VLDL Cholesterol Cal: 12 mg/dL (ref 5–40)

## 2022-09-07 LAB — APO A1 + B + RATIO
Apolipo. B/A-1 Ratio: 0.6 ratio (ref 0.0–0.6)
Apolipoprotein A-1: 149 mg/dL (ref 116–209)
Apolipoprotein B: 91 mg/dL — ABNORMAL HIGH (ref <90)

## 2022-09-07 LAB — HEMOGLOBIN A1C
Est. average glucose Bld gHb Est-mCnc: 103 mg/dL
Hgb A1c MFr Bld: 5.2 % (ref 4.8–5.6)

## 2022-09-07 LAB — HIGH SENSITIVITY CRP: CRP, High Sensitivity: 2.3 mg/L (ref 0.00–3.00)

## 2022-09-07 LAB — LIPOPROTEIN A (LPA): Lipoprotein (a): 127.2 nmol/L — ABNORMAL HIGH (ref <75.0)

## 2022-09-07 LAB — GLUCOSE, FASTING: Glucose, Plasma: 86 mg/dL (ref 70–99)

## 2022-09-08 DIAGNOSIS — Z6828 Body mass index (BMI) 28.0-28.9, adult: Secondary | ICD-10-CM | POA: Diagnosis not present

## 2022-09-08 DIAGNOSIS — Z01419 Encounter for gynecological examination (general) (routine) without abnormal findings: Secondary | ICD-10-CM | POA: Diagnosis not present

## 2022-09-08 DIAGNOSIS — Z124 Encounter for screening for malignant neoplasm of cervix: Secondary | ICD-10-CM | POA: Diagnosis not present

## 2022-09-08 DIAGNOSIS — Z1151 Encounter for screening for human papillomavirus (HPV): Secondary | ICD-10-CM | POA: Diagnosis not present

## 2022-09-08 LAB — RESULTS CONSOLE HPV: CHL HPV: NEGATIVE

## 2022-09-08 LAB — HM PAP SMEAR: HM Pap smear: NEGATIVE

## 2023-01-25 NOTE — Patient Instructions (Addendum)
  HEALTH MAINTENANCE RECOMMENDATIONS:  It is recommended that you get at least 30 minutes of aerobic exercise at least 5 days/week (for weight loss, you may need as much as 60-90 minutes). This can be any activity that gets your heart rate up. This can be divided in 10-15 minute intervals if needed, but try and build up your endurance at least once a week.  Weight bearing exercise is also recommended twice weekly.  Eat a healthy diet with lots of vegetables, fruits and fiber.  "Colorful" foods have a lot of vitamins (ie green vegetables, tomatoes, red peppers, etc).  Limit sweet tea, regular sodas and alcoholic beverages, all of which has a lot of calories and sugar.  Up to 1 alcoholic drink daily may be beneficial for women (unless trying to lose weight, watch sugars).  Drink a lot of water.  Calcium recommendations are 1200-1500 mg daily (1500 mg for postmenopausal women or women without ovaries), and vitamin D 1000 IU daily.  This should be obtained from diet and/or supplements (vitamins), and calcium should not be taken all at once, but in divided doses.  Monthly self breast exams and yearly mammograms for women over the age of 57 is recommended.  Sunscreen of at least SPF 30 should be used on all sun-exposed parts of the skin when outside between the hours of 10 am and 4 pm (not just when at beach or pool, but even with exercise, golf, tennis, and yard work!)  Use a sunscreen that says "broad spectrum" so it covers both UVA and UVB rays, and make sure to reapply every 1-2 hours.  Remember to change the batteries in your smoke detectors when changing your clock times in the spring and fall. Carbon monoxide detectors are recommended for your home.  Use your seat belt every time you are in a car, and please drive safely and not be distracted with cell phones and texting while driving.  Continue yearly flu shots. COVID boosters discussed.  Contact us after your insurance changes (and get [place] a  copy of the card) for a sleep study referral (home test), if you are still having unrefreshed sleep and feeling tired during the day.  Try and increase your exercise, as we discussed. Consider trying claritin or allegra in place of zyrtec, to see if the zyrtec contributes to your fatigue.

## 2023-01-25 NOTE — Progress Notes (Signed)
Chief Complaint  Patient presents with   Annual Exam    Fasting annual exam no gyn exam sees Dr. Langston Masker. Is concerned about weight gain. And has been having monthy HA's at the end of her cycle that last 3 days. (I will get GYN records-had pap this year). Did not get flu shot last year. Does not want covid booster today.     Wanda Fleming is a 37 y.o. female who presents for a complete physical.  She saw her GYN in 08/2022. No records received (for years).  She has copper IUD with heavy cycles. She recalls that FS Hgb was normal.  She isn't taking iron regularly, remembers to take it about every other cycle. She does report some fatigue, no LH/dizziness. She sleeps 7 hours/night.  Not aware of snoring. She wakes up feeling tired, even with 8 hours of sleep.  She has had HA's at the end of her cycle for the last couple of months.  Felt like tension HA's, worse as the day progressed (tight band across her head).  She had a "migraine protocol" massage, which helped her HA's.  +known clenching/grinding. Wakes up with tight jaw.  She sleeps with retainers since braces removed.  Weight gain concerns.  She reports being less active (some foot pain and dog was injured, so walking less). She got down to high 160's, was in the low 170's earlier this year. She did a sprint triathlon in the Fall. Denies changes to diet, "hasn't been that great".  Not always getting enough veggies. Sometimes skip meals.  Limited fast foods (except last week when in [place] with her family, fried foods)--fries once a week.  Hand eczema, treated with TAC 0.1% prn with good results in the past, hasn't needed any in the last couple of years.  H/o Vitamin D deficiency--Last level was normal at 37.5 in 01/2022, when taking 2000 IU of D3 nightly. She is currently taking 2000 IU tablet. Feels tired still.  Anxiety--reports she has done some intermittent on-linecounseling.  She thinks she needs to do this more regularly, not managing  anxiety quite as well on her own as she used to.  Family h/o cerebral aneurysms. She underwent screening MRA in 06/2020 which did not show any aneurysm. She denies headaches other than those mentioned above.  H/o mild hyperlipidemia. She had requested additional testing to be checked in January, along with screening for diabetes with A1c and fasting glucose. Her lipids had improved.  Lipoprotein (a) was elevated. Average risk hs-CRP.  Component Ref Range & Units 4 mo ago 1 yr ago 3 yr ago 4 yr ago 8 yr ago  Cholesterol, Total 100 - 199 mg/dL 811 914 High  782 956   Triglycerides 0 - 149 mg/dL 61 213 73 086 46 R  HDL >39 mg/dL 54 58 54 50 62 R, CM  VLDL Cholesterol Cal 5 - 40 mg/dL 12 18 14 26    LDL Chol Calc (NIH) 0 - 99 mg/dL 578 High  469 High  629 High     Chol/HDL Ratio 0.0 - 4.4 ratio 3.3 3.7 CM 3.2 CM 3.7 CM 2.9 R   Component Ref Range & Units 4 mo ago  Lipoprotein (a) <75.0 nmol/L 127.2 High    Component Ref Range & Units 4 mo ago  Apolipoprotein A-1 116 - 209 mg/dL 528  Apolipoprotein B <41 mg/dL 91 High   Comment:  Desirable               < 90                          Borderline High     90 -  99                          High               100 - 130                          Very High               >130   Component Ref Range & Units 4 mo ago  CRP, High Sensitivity 0.00 - 3.00 mg/L 2.30  Comment:          Relative Risk for Future Cardiovascular Event                              Low                 <1.00                              Average       1.00 - 3.00                              High                >3.00   Fasting glucose 86  Lab Results  Component Value Date   HGBA1C 5.2 09/06/2022     Immunization History  Administered Date(s) Administered   Influenza Inj Mdck Quad Pf 06/12/2018   Influenza,inj,Quad PF,6+ Mos 05/24/2019   Influenza-Unspecified 05/03/2020, 07/30/2021   Moderna SARS-COV2 Booster Vaccination 07/02/2021    Moderna Sars-Covid-2 Vaccination 11/01/2019, 11/29/2019, 07/17/2020   Tdap 07/16/2018   Last Pap smear: 07/2017 normal (last we received), per GYN.  Last visit 08/2022, no records received Had pap. Last mammogram: never. She had negative BRCA gene testing in past due to FHx Last colonoscopy: never Last DEXA: never Dentist: twice a year Ophtho: yearly Exercise: walking 1 mile/day with the dog. Uses punching bag some. Weights off/on, not regularly. She did a sprint triathlon in the Fall.  She had genetic testing (BRCA and others) through her GYN, reportedly normal.   PMH, PSH, SH and FH reviewed and updated  Outpatient Encounter Medications as of 01/26/2023  Medication Sig Note   cetirizine (ZYRTEC) 10 MG tablet Take 10 mg by mouth daily.    Cholecalciferol (VITAMIN D) 50 MCG (2000 UT) CAPS Take 1 capsule by mouth daily.    Copper IUD by Intrauterine route.    [DISCONTINUED] Cholecalciferol (VITAMIN D3 ADULT GUMMIES PO) Take 2,000 Int'l Units by mouth daily.     [DISCONTINUED] fluticasone (FLONASE) 50 MCG/ACT nasal spray Place 2 sprays into both nostrils daily. 01/24/2022: Uses seasonally, not currently taking   [DISCONTINUED] OVER THE COUNTER MEDICATION Take 30 mLs by mouth daily. 01/24/2022: CBD oil, daily   No facility-administered encounter medications on file as of 01/26/2023.   Allergies  Allergen Reactions   Ibuprofen Nausea And Vomiting    ROS:  The  patient denies anorexia, fever, vision changes, decreased hearing, ear pain, sore throat, breast concerns, chest pain, palpitations, dizziness, syncope, dyspnea on exertion, cough, swelling, nausea, vomiting, diarrhea, constipation, abdominal pain, melena, hematochezia, indigestion/heartburn, hematuria, incontinence, dysuria, irregular menstrual cycles, vaginal discharge, odor or itch, joint pains, numbness, tingling, weakness, tremor, suspicious skin lesions, depression, abnormal bleeding/bruising, or enlarged lymph nodes.  No  flares of eczema in the last couple of years Menses are regular, heavy, since having IUD. Has some cramps x 2d.  Mini-Tens unit helps. Prominent veins on legs, unchanged; not itchy or painful. Stopped CBD oil when she ran out a while ago.  Has some anxiety, per HPI has started with some counseling. Gets Mittelschmerz monthly  Fatigue, HA's and weight change per HPI   PHYSICAL EXAM:  BP 120/70   Pulse 84   Ht 5\' 7"  (1.702 m)   Wt 186 lb 12.8 oz (84.7 kg)   LMP 01/01/2023   BMI 29.26 kg/m   Wt Readings from Last 3 Encounters:  01/26/23 186 lb 12.8 oz (84.7 kg)  01/24/22 176 lb 12.8 oz (80.2 kg)  01/21/21 179 lb 9.6 oz (81.5 kg)    General Appearance:    Alert, cooperative, no distress, appears stated age. She didn't change into gown, wearing jeans  Head:    Normocephalic, without obvious abnormality, atraumatic  Eyes:    PERRL, conjunctiva/corneas clear, EOM's intact, fundi benign  Ears:    Normal TM's and external ear canals  Nose:   Normal, no drainage or sinus tenderness  Throat:   Normal mucosa, no lesions or erythema.   Neck:   Supple, no lymphadenopathy;  thyroid:  no enlargement/ tenderness/nodules; no carotid bruit or JVD  Back:    Spine nontender, no curvature, no CVA  tenderness  Lungs:     Clear to auscultation bilaterally without wheezes, rales or  ronchi; respirations unlabored  Chest Wall:    No tenderness or deformity   Heart:    Regular rate and rhythm, S1 and S2 normal, no murmur, rub   or gallop  Breast Exam:    Deferred to GYN  Abdomen:     Soft, non-tender, nondistended, normoactive bowel sounds,   no masses, no hepatosplenomegaly  Genitalia:    Deferred to GYN       Extremities:   No clubbing, cyanosis or edema  Pulses:   2+ and symmetric all extremities  Skin:   Skin color, texture, turgor normal, no rashes or lesions. Limited evaluation, not in gown, wearing jeans  Lymph nodes:   Cervical, supraclavicular nodes normal  Neurologic:   Normal strength,  sensation and gait; reflexes 2+ and symmetric throughout                               Psych:   Normal mood, affect, hygiene and grooming. Became slightly tearful at one point (when discussing her menstrual cycles), but overall full range of affect.     ASSESSMENT/PLAN:  Annual physical exam  Elevated lipoprotein(a) - overall lipids improved. Cont lowfat, low cholesterol diet.  Currently no treatments for lp(a). Recs for reduced CV risks overall (exercise, wt loss, diet)  Vitamin D deficiency - cont daily supplements; normal values on same dose last year, declines recheck  Fatigue, unspecified type - Ddx reviewed. Unrefreshed sleep, discussed sleep study. Will consider when insurance changes.  Iron deficiency anemia, unspecified iron deficiency anemia type - h/o in past; has heavy cycles.  Nl  Hgb per pt in Jan, declined recheck. Discussed options to address heavy cycles (contrib by her copper IUD)  Weight gain - discussed diet, exercise. No other thyroid sx, declines recheck.  BMI 29.0-29.9,adult - counseled re: healthy diet, portions, snacks, exercise  Requesting records from GYN.  Tearful discussing cycles, fatigue affecting it. Discussed OCP, Mirena IUD. She can further discuss with GYN if wants to change.  Discussed sleep study. Insurance is changing in July, prefers to wait.   Discussed monthly self breast exams and yearly mammograms after the age of 19; at least 30 minutes of aerobic activity at least 5 days/week, weight-bearing exercise at least 2x/week; proper sunscreen use reviewed; healthy diet, including goals of calcium and vitamin D intake and alcohol recommendations (less than or equal to 1 drink/day) reviewed; regular seatbelt use; changing batteries in smoke detectors, carbon monoxide detectors.  Immunization recommendations discussed--yearly flu shots recommended. Updated COVID booster recommended in the Fall, when available (declined now).  Colonoscopy recommendations  reviewed (age 21)  F/u 1 year, sooner prn.

## 2023-01-26 ENCOUNTER — Encounter: Payer: Self-pay | Admitting: Family Medicine

## 2023-01-26 ENCOUNTER — Ambulatory Visit (INDEPENDENT_AMBULATORY_CARE_PROVIDER_SITE_OTHER): Payer: BC Managed Care – PPO | Admitting: Family Medicine

## 2023-01-26 VITALS — BP 120/70 | HR 84 | Ht 67.0 in | Wt 186.8 lb

## 2023-01-26 DIAGNOSIS — E7841 Elevated Lipoprotein(a): Secondary | ICD-10-CM

## 2023-01-26 DIAGNOSIS — Z0000 Encounter for general adult medical examination without abnormal findings: Secondary | ICD-10-CM

## 2023-01-26 DIAGNOSIS — R5383 Other fatigue: Secondary | ICD-10-CM | POA: Diagnosis not present

## 2023-01-26 DIAGNOSIS — D509 Iron deficiency anemia, unspecified: Secondary | ICD-10-CM

## 2023-01-26 DIAGNOSIS — Z6829 Body mass index (BMI) 29.0-29.9, adult: Secondary | ICD-10-CM

## 2023-01-26 DIAGNOSIS — R635 Abnormal weight gain: Secondary | ICD-10-CM

## 2023-01-26 DIAGNOSIS — E559 Vitamin D deficiency, unspecified: Secondary | ICD-10-CM

## 2023-01-30 ENCOUNTER — Encounter: Payer: Self-pay | Admitting: Family Medicine

## 2023-02-08 DIAGNOSIS — F411 Generalized anxiety disorder: Secondary | ICD-10-CM | POA: Diagnosis not present

## 2023-02-10 ENCOUNTER — Telehealth: Payer: Self-pay | Admitting: Family Medicine

## 2023-02-10 NOTE — Telephone Encounter (Signed)
Medical Records Received from [facility].

## 2023-02-13 ENCOUNTER — Encounter: Payer: Self-pay | Admitting: *Deleted

## 2023-03-01 DIAGNOSIS — F411 Generalized anxiety disorder: Secondary | ICD-10-CM | POA: Diagnosis not present

## 2023-03-15 DIAGNOSIS — F411 Generalized anxiety disorder: Secondary | ICD-10-CM | POA: Diagnosis not present

## 2023-04-19 DIAGNOSIS — F411 Generalized anxiety disorder: Secondary | ICD-10-CM | POA: Diagnosis not present

## 2023-05-09 DIAGNOSIS — F411 Generalized anxiety disorder: Secondary | ICD-10-CM | POA: Diagnosis not present

## 2023-09-11 DIAGNOSIS — Z01419 Encounter for gynecological examination (general) (routine) without abnormal findings: Secondary | ICD-10-CM | POA: Diagnosis not present

## 2023-09-11 DIAGNOSIS — Z6830 Body mass index (BMI) 30.0-30.9, adult: Secondary | ICD-10-CM | POA: Diagnosis not present

## 2023-10-17 DIAGNOSIS — F411 Generalized anxiety disorder: Secondary | ICD-10-CM | POA: Diagnosis not present

## 2023-11-02 DIAGNOSIS — F411 Generalized anxiety disorder: Secondary | ICD-10-CM | POA: Diagnosis not present

## 2023-11-30 DIAGNOSIS — F411 Generalized anxiety disorder: Secondary | ICD-10-CM | POA: Diagnosis not present

## 2023-12-13 DIAGNOSIS — F411 Generalized anxiety disorder: Secondary | ICD-10-CM | POA: Diagnosis not present

## 2023-12-18 DIAGNOSIS — Z01818 Encounter for other preprocedural examination: Secondary | ICD-10-CM | POA: Diagnosis not present

## 2023-12-27 NOTE — H&P (Signed)
 Wanda Fleming is an 38 y.o. female G0 with desire for permanent sterility and hx of dysmenorrhea.  Patient has second Paragard in place; inserted 04/03/19.  Patient reports dysmenorrhea pre-dates Paragard insertion.    Pertinent Gynecological History: Menses: flow is excessive with use of 6 pads or tampons on heaviest days Bleeding: regular Contraception: IUD DES exposure: unknown Blood transfusions: none Sexually transmitted diseases: no past history Previous GYN Procedures: none  Last mammogram: n/a Date: n/a Last pap: normal Date: 09/08/22 OB History: G0   Menstrual History: Menarche age: n/a No LMP recorded.    Past Medical History:  Diagnosis Date   IUD (intrauterine device) in place 04/03/2019   insertion date    No past surgical history on file.  Family History  Problem Relation Age of Onset   Cerebral aneurysm Mother    Hypertension Mother    Miscarriages / Stillbirths Mother    Heart attack Father 81   Peripheral Artery Disease Father        in LE's   Hashimoto's thyroiditis Sister    Polycystic ovary syndrome Sister    Peripheral Artery Disease Paternal Grandfather    Diabetes Maternal Aunt    Cancer Maternal Aunt        breast   Breast cancer Maternal Aunt        early 50's   Cerebral aneurysm Maternal Aunt    Obesity Maternal Aunt    Cerebral aneurysm Maternal Uncle    Diabetes Paternal Aunt    Thyroid disease Paternal Aunt    Polycystic ovary syndrome Cousin    Endometriosis Cousin    Heart disease Maternal Grandmother     Social History:  reports that she has never smoked. She has never used smokeless tobacco. She reports current alcohol use. She reports that she does not use drugs.  Allergies:  Allergies  Allergen Reactions   Ibuprofen Nausea And Vomiting    No medications prior to admission.    Review of Systems  There were no vitals taken for this visit. Physical Exam Constitutional:      Appearance: Normal appearance.  HENT:      Head: Normocephalic and atraumatic.  Pulmonary:     Effort: Pulmonary effort is normal.  Abdominal:     Palpations: Abdomen is soft.  Musculoskeletal:        General: Normal range of motion.     Cervical back: Normal range of motion.  Skin:    General: Skin is warm and dry.  Neurological:     Mental Status: She is alert and oriented to person, place, and time.  Psychiatric:        Mood and Affect: Mood normal.        Behavior: Behavior normal.     No results found for this or any previous visit (from the past 24 hours).  No results found.  Assessment/Plan: 38yo G0 with desire for permanent sterility and dysmenorrhea.  -Dx L/S with possible removal of endometriosis, b/l salpingectomy and IUD removal -Patient is counseled re: risk of bleeding, infection, scarring and damage to surrounding structures.  She is informed of steps of procedure as well as postop expectations and limitations.  Patient is counseled re: risk of permanence and regret.  She desires no future child-bearing potential.  Patient is counseled that it is possible that when we look, it is possible that entirely normal anatomy may be found which would not provide etiology for pain.  All questions were answered and patient wishes to proceed.  Wanda Fleming 12/27/2023, 2:03 PM

## 2023-12-28 DIAGNOSIS — F411 Generalized anxiety disorder: Secondary | ICD-10-CM | POA: Diagnosis not present

## 2024-01-02 ENCOUNTER — Other Ambulatory Visit: Payer: Self-pay

## 2024-01-02 ENCOUNTER — Encounter (HOSPITAL_COMMUNITY): Payer: Self-pay | Admitting: Obstetrics & Gynecology

## 2024-01-02 NOTE — Progress Notes (Signed)
 PCP - Roosvelt Colla, MD  Cardiologist -   PPM/ICD - denies Device Orders - n/a Rep Notified - n/a  Chest x-ray - denies EKG - denies Stress Test - denies ECHO - denies Cardiac Cath - denies  CPAP - denies  DM - denies  Blood Thinner Instructions: denies Aspirin Instructions: denies  ERAS Protcol - NPO  COVID TEST- n/a  Anesthesia review: no  Patient verbally denies any shortness of breath, fever, cough and chest pain during phone call   -------------  SDW INSTRUCTIONS given:  Your procedure is scheduled on Jan 04, 2024.  Report to [facility] Main Entrance "A" at 5:30 A.M., and check in at the Admitting office.  Call this number if you have problems the morning of surgery:  [phone]   Remember:  Do not eat  or drink after midnight the night before your surgery      Take these medicines the morning of surgery with A SIP OF WATER NONE  As of today, STOP taking any Aspirin (unless otherwise instructed by your surgeon) Aleve, Naproxen, Ibuprofen, Motrin, Advil, Goody's, BC's, all herbal medications, fish oil, and all vitamins.                      Do not wear jewelry, make up, or nail polish            Do not wear lotions, powders, perfumes/colognes, or deodorant.            Do not shave 48 hours prior to surgery.  Men may shave face and neck.            Do not bring valuables to the hospital.            [facility] is not responsible for any belongings or valuables.  Do NOT Smoke (Tobacco/Vaping) 24 hours prior to your procedure If you use a CPAP at night, you may bring all equipment for your overnight stay.   Contacts, glasses, dentures or bridgework may not be worn into surgery.      For patients admitted to the hospital, discharge time will be determined by your treatment team.   Patients discharged the day of surgery will not be allowed to drive home, and someone needs to stay with them for 24 hours.    Special instructions:   [place]- Preparing  For Surgery  Before surgery, you can play an important role. Because skin is not sterile, your skin needs to be as free of germs as possible. You can reduce the number of germs on your skin by washing with CHG (chlorahexidine gluconate) Soap before surgery.  CHG is an antiseptic cleaner which kills germs and bonds with the skin to continue killing germs even after washing.    Oral Hygiene is also important to reduce your risk of infection.  Remember - BRUSH YOUR TEETH THE MORNING OF SURGERY WITH YOUR REGULAR TOOTHPASTE  Please do not use if you have an allergy to CHG or antibacterial soaps. If your skin becomes reddened/irritated stop using the CHG.  Do not shave (including legs and underarms) for at least 48 hours prior to first CHG shower. It is OK to shave your face.  Please follow these instructions carefully.   Shower the NIGHT BEFORE SURGERY and the MORNING OF SURGERY with DIAL Soap.   Pat yourself dry with a CLEAN TOWEL.  Wear CLEAN PAJAMAS to bed the night before surgery  Place CLEAN SHEETS on your bed the night  of your first shower and DO NOT SLEEP WITH PETS.   Day of Surgery: Please shower morning of surgery  Wear Clean/Comfortable clothing the morning of surgery Do not apply any deodorants/lotions.   Remember to brush your teeth WITH YOUR REGULAR TOOTHPASTE.   Questions were answered. Patient verbalized understanding of instructions.

## 2024-01-03 MED ORDER — PHENYLEPHRINE HCL-NACL 20-0.9 MG/250ML-% IV SOLN
INTRAVENOUS | Status: AC
  Filled 2024-01-03: qty 500

## 2024-01-03 MED ORDER — PROPOFOL 1000 MG/100ML IV EMUL
INTRAVENOUS | Status: AC
  Filled 2024-01-03: qty 200

## 2024-01-03 NOTE — Anesthesia Preprocedure Evaluation (Addendum)
 Anesthesia Evaluation  Patient identified by MRN, date of birth, ID band Patient awake    Reviewed: Allergy & Precautions, NPO status , Patient's Chart, lab work & pertinent test results  Airway Mallampati: I  TM Distance: >3 FB Neck ROM: Full    Dental no notable dental hx. (+) Dental Advisory Given, Teeth Intact   Pulmonary neg pulmonary ROS   Pulmonary exam normal breath sounds clear to auscultation       Cardiovascular negative cardio ROS Normal cardiovascular exam Rhythm:Regular Rate:Normal     Neuro/Psych  PSYCHIATRIC DISORDERS Anxiety     negative neurological ROS     GI/Hepatic negative GI ROS, Neg liver ROS,,,  Endo/Other  negative endocrine ROS    Renal/GU negative Renal ROS     Musculoskeletal negative musculoskeletal ROS (+)    Abdominal   Peds  Hematology negative hematology ROS (+)   Anesthesia Other Findings   Reproductive/Obstetrics negative OB ROS                             Anesthesia Physical Anesthesia Plan  ASA: 2  Anesthesia Plan: General   Post-op Pain Management: Tylenol PO (pre-op)*, Gabapentin PO (pre-op)* and Toradol IV (intra-op)*   Induction: Intravenous  PONV Risk Score and Plan: 4 or greater and Treatment may vary due to age or medical condition, Midazolam, Ondansetron and Dexamethasone  Airway Management Planned: Oral ETT  Additional Equipment:   Intra-op Plan:   Post-operative Plan: Extubation in OR  Informed Consent: I have reviewed the patients History and Physical, chart, labs and discussed the procedure including the risks, benefits and alternatives for the proposed anesthesia with the patient or authorized representative who has indicated his/her understanding and acceptance.     Dental advisory given  Plan Discussed with: CRNA  Anesthesia Plan Comments:        Anesthesia Quick Evaluation

## 2024-01-04 ENCOUNTER — Ambulatory Visit (HOSPITAL_BASED_OUTPATIENT_CLINIC_OR_DEPARTMENT_OTHER)

## 2024-01-04 ENCOUNTER — Encounter (HOSPITAL_COMMUNITY): Payer: Self-pay | Admitting: Obstetrics & Gynecology

## 2024-01-04 ENCOUNTER — Ambulatory Visit (HOSPITAL_COMMUNITY)

## 2024-01-04 ENCOUNTER — Ambulatory Visit (HOSPITAL_COMMUNITY)
Admission: RE | Admit: 2024-01-04 | Discharge: 2024-01-04 | Disposition: A | Discharge status: Home / Self Care | Payer: BC Managed Care – PPO | Source: Ambulatory Visit | Attending: Obstetrics & Gynecology | Admitting: Obstetrics & Gynecology

## 2024-01-04 ENCOUNTER — Other Ambulatory Visit: Payer: Self-pay

## 2024-01-04 ENCOUNTER — Encounter (HOSPITAL_COMMUNITY)
Admission: RE | Disposition: A | Discharge status: Home / Self Care | Payer: Self-pay | Source: Ambulatory Visit | Attending: Obstetrics & Gynecology

## 2024-01-04 DIAGNOSIS — Z30432 Encounter for removal of intrauterine contraceptive device: Secondary | ICD-10-CM | POA: Diagnosis not present

## 2024-01-04 DIAGNOSIS — N946 Dysmenorrhea, unspecified: Secondary | ICD-10-CM | POA: Diagnosis present

## 2024-01-04 DIAGNOSIS — Z302 Encounter for sterilization: Secondary | ICD-10-CM

## 2024-01-04 DIAGNOSIS — N736 Female pelvic peritoneal adhesions (postinfective): Secondary | ICD-10-CM | POA: Insufficient documentation

## 2024-01-04 HISTORY — PX: IUD REMOVAL: SHX5392

## 2024-01-04 HISTORY — PX: LAPAROSCOPIC BILATERAL SALPINGECTOMY: SHX5889

## 2024-01-04 HISTORY — DX: Anxiety disorder, unspecified: F41.9

## 2024-01-04 LAB — CBC
HCT: 38.9 % (ref 36.0–46.0)
Hemoglobin: 12.7 g/dL (ref 12.0–15.0)
MCH: 26.7 pg (ref 26.0–34.0)
MCHC: 32.6 g/dL (ref 30.0–36.0)
MCV: 81.9 fL (ref 80.0–100.0)
Platelets: 337 10*3/uL (ref 150–400)
RBC: 4.75 MIL/uL (ref 3.87–5.11)
RDW: 12.6 % (ref 11.5–15.5)
WBC: 7.6 10*3/uL (ref 4.0–10.5)
nRBC: 0 % (ref 0.0–0.2)

## 2024-01-04 LAB — TYPE AND SCREEN
ABO/RH(D): O POS
Antibody Screen: NEGATIVE

## 2024-01-04 LAB — ABO/RH: ABO/RH(D): O POS

## 2024-01-04 LAB — POCT PREGNANCY, URINE: Preg Test, Ur: NEGATIVE

## 2024-01-04 SURGERY — REMOVAL, INTRAUTERINE DEVICE
Anesthesia: General | Site: Uterus

## 2024-01-04 MED ORDER — AMISULPRIDE (ANTIEMETIC) 5 MG/2ML IV SOLN
INTRAVENOUS | Status: AC
  Filled 2024-01-04: qty 4

## 2024-01-04 MED ORDER — LIDOCAINE HCL (CARDIAC) PF 100 MG/5ML IV SOSY
PREFILLED_SYRINGE | INTRAVENOUS | Status: DC | PRN
  Administered 2024-01-04: 80 mg via INTRAVENOUS

## 2024-01-04 MED ORDER — ACETAMINOPHEN 500 MG PO TABS
1000.0000 mg | ORAL_TABLET | Freq: Once | ORAL | Status: AC
  Administered 2024-01-04: 1000 mg via ORAL
  Filled 2024-01-04: qty 2

## 2024-01-04 MED ORDER — PROPOFOL 10 MG/ML IV BOLUS
INTRAVENOUS | Status: DC | PRN
  Administered 2024-01-04: 200 mg via INTRAVENOUS

## 2024-01-04 MED ORDER — ONDANSETRON HCL 4 MG/2ML IJ SOLN
INTRAMUSCULAR | Status: DC | PRN
  Administered 2024-01-04: 4 mg via INTRAVENOUS

## 2024-01-04 MED ORDER — FENTANYL CITRATE (PF) 100 MCG/2ML IJ SOLN
INTRAMUSCULAR | Status: DC | PRN
  Administered 2024-01-04: 50 ug via INTRAVENOUS
  Administered 2024-01-04 (×2): 100 ug via INTRAVENOUS

## 2024-01-04 MED ORDER — PROPOFOL 10 MG/ML IV BOLUS
INTRAVENOUS | Status: AC
  Filled 2024-01-04: qty 20

## 2024-01-04 MED ORDER — ORAL CARE MOUTH RINSE: 15.0000 mL | Freq: Once | OROMUCOSAL | Status: AC

## 2024-01-04 MED ORDER — DEXAMETHASONE SODIUM PHOSPHATE 10 MG/ML IJ SOLN
INTRAMUSCULAR | Status: AC
  Filled 2024-01-04: qty 1

## 2024-01-04 MED ORDER — MIDAZOLAM HCL 2 MG/2ML IJ SOLN
INTRAMUSCULAR | Status: AC
Start: 2024-01-04 — End: ?
  Filled 2024-01-04: qty 2

## 2024-01-04 MED ORDER — ONDANSETRON HCL 4 MG/2ML IJ SOLN
INTRAMUSCULAR | Status: AC
  Filled 2024-01-04: qty 2

## 2024-01-04 MED ORDER — FENTANYL CITRATE (PF) 250 MCG/5ML IJ SOLN
INTRAMUSCULAR | Status: AC
  Filled 2024-01-04: qty 5

## 2024-01-04 MED ORDER — LIDOCAINE 2% (20 MG/ML) 5 ML SYRINGE
INTRAMUSCULAR | Status: AC
  Filled 2024-01-04: qty 5

## 2024-01-04 MED ORDER — SCOPOLAMINE 1 MG/3DAYS TD PT72
1.0000 | MEDICATED_PATCH | TRANSDERMAL | Status: DC
  Administered 2024-01-04: 1.5 mg via TRANSDERMAL
  Filled 2024-01-04: qty 1

## 2024-01-04 MED ORDER — KETOROLAC TROMETHAMINE 30 MG/ML IJ SOLN
INTRAMUSCULAR | Status: DC | PRN
  Administered 2024-01-04: 30 mg via INTRAVENOUS

## 2024-01-04 MED ORDER — ROCURONIUM BROMIDE 10 MG/ML (PF) SYRINGE
PREFILLED_SYRINGE | INTRAVENOUS | Status: AC
  Filled 2024-01-04: qty 10

## 2024-01-04 MED ORDER — LACTATED RINGERS IV SOLN: INTRAVENOUS | Status: DC

## 2024-01-04 MED ORDER — POVIDONE-IODINE 10 % EX SWAB
2.0000 | Freq: Once | CUTANEOUS | Status: AC
  Administered 2024-01-04: 2 via TOPICAL

## 2024-01-04 MED ORDER — SODIUM CHLORIDE (PF) 0.9 % IJ SOLN
INTRAMUSCULAR | Status: AC
  Filled 2024-01-04: qty 10

## 2024-01-04 MED ORDER — DEXAMETHASONE SODIUM PHOSPHATE 10 MG/ML IJ SOLN
INTRAMUSCULAR | Status: DC | PRN
  Administered 2024-01-04: 10 mg via INTRAVENOUS

## 2024-01-04 MED ORDER — GABAPENTIN 300 MG PO CAPS
300.0000 mg | ORAL_CAPSULE | Freq: Once | ORAL | Status: AC
  Administered 2024-01-04: 300 mg via ORAL
  Filled 2024-01-04: qty 1

## 2024-01-04 MED ORDER — KETOROLAC TROMETHAMINE 30 MG/ML IJ SOLN
INTRAMUSCULAR | Status: AC
  Filled 2024-01-04: qty 1

## 2024-01-04 MED ORDER — OXYCODONE HCL 5 MG/5ML PO SOLN: 5.0000 mg | Freq: Once | ORAL | Status: DC | PRN

## 2024-01-04 MED ORDER — MIDAZOLAM HCL 5 MG/5ML IJ SOLN
INTRAMUSCULAR | Status: DC | PRN
  Administered 2024-01-04: 2 mg via INTRAVENOUS

## 2024-01-04 MED ORDER — CHLORHEXIDINE GLUCONATE 0.12 % MT SOLN
15.0000 mL | Freq: Once | OROMUCOSAL | Status: AC
  Administered 2024-01-04: 15 mL via OROMUCOSAL
  Filled 2024-01-04: qty 15

## 2024-01-04 MED ORDER — BUPIVACAINE HCL (PF) 0.25 % IJ SOLN
INTRAMUSCULAR | Status: AC
  Filled 2024-01-04: qty 30

## 2024-01-04 MED ORDER — OXYCODONE HCL 5 MG PO TABS: 5.0000 mg | ORAL_TABLET | Freq: Once | ORAL | Status: DC | PRN

## 2024-01-04 MED ORDER — BUPIVACAINE HCL (PF) 0.25 % IJ SOLN
INTRAMUSCULAR | Status: DC | PRN
  Administered 2024-01-04: 7 mL

## 2024-01-04 MED ORDER — HYDROMORPHONE HCL 1 MG/ML IJ SOLN
0.2500 mg | INTRAMUSCULAR | Status: DC | PRN
Start: 2024-01-04 — End: 2024-01-04

## 2024-01-04 MED ORDER — AMISULPRIDE (ANTIEMETIC) 5 MG/2ML IV SOLN
10.0000 mg | Freq: Once | INTRAVENOUS | Status: AC
  Administered 2024-01-04: 10 mg via INTRAVENOUS

## 2024-01-04 MED ORDER — DROPERIDOL 2.5 MG/ML IJ SOLN
INTRAMUSCULAR | Status: AC
  Filled 2024-01-04: qty 2

## 2024-01-04 MED ORDER — DROPERIDOL 2.5 MG/ML IJ SOLN
0.6250 mg | Freq: Once | INTRAMUSCULAR | Status: AC | PRN
  Administered 2024-01-04: 0.625 mg via INTRAVENOUS

## 2024-01-04 MED ORDER — SUGAMMADEX SODIUM 200 MG/2ML IV SOLN
INTRAVENOUS | Status: DC | PRN
  Administered 2024-01-04: 200 mg via INTRAVENOUS

## 2024-01-04 MED ORDER — OXYCODONE-ACETAMINOPHEN 5-325 MG PO TABS
1.0000 | ORAL_TABLET | ORAL | 0 refills | Status: AC | PRN
Start: 2024-01-04 — End: 2024-01-09

## 2024-01-04 MED ORDER — ROCURONIUM BROMIDE 100 MG/10ML IV SOLN
INTRAVENOUS | Status: DC | PRN
  Administered 2024-01-04: 50 mg via INTRAVENOUS

## 2024-01-04 SURGICAL SUPPLY — 30 items
BARRIER ADHS 3X4 INTERCEED (GAUZE/BANDAGES/DRESSINGS) IMPLANT
BENZOIN TINCTURE PRP APPL 2/3 (GAUZE/BANDAGES/DRESSINGS) ×3 IMPLANT
CATH ROBINSON RED A/P 16FR (CATHETERS) ×3 IMPLANT
COVER MAYO STAND STRL (DRAPES) ×3 IMPLANT
DERMABOND ADVANCED .7 DNX12 (GAUZE/BANDAGES/DRESSINGS) IMPLANT
DRAPE SURG IRRIG POUCH 19X23 (DRAPES) ×3 IMPLANT
DURAPREP 26ML APPLICATOR (WOUND CARE) ×3 IMPLANT
FORCEPS CUTTING 45CM 5MM (CUTTING FORCEPS) IMPLANT
GLOVE BIOGEL PI IND STRL 6 (GLOVE) ×6 IMPLANT
GLOVE BIOGEL PI IND STRL 7.0 (GLOVE) ×3 IMPLANT
GLOVE SS PI 5.5 STRL (GLOVE) ×3 IMPLANT
GOWN STRL REUS W/ TWL LRG LVL3 (GOWN DISPOSABLE) ×9 IMPLANT
IRRIGATION SUCT STRKRFLW 2 WTP (MISCELLANEOUS) IMPLANT
KIT PINK PAD W/HEAD ARE REST (MISCELLANEOUS) ×3 IMPLANT
KIT PINK PAD W/HEAD ARM REST (MISCELLANEOUS) ×3 IMPLANT
KIT TURNOVER KIT B (KITS) ×3 IMPLANT
NDL INSUFFLATION 14GA 120MM (NEEDLE) ×3 IMPLANT
NEEDLE INSUFFLATION 14GA 120MM (NEEDLE) ×3 IMPLANT
NS IRRIG 1000ML POUR BTL (IV SOLUTION) ×3 IMPLANT
PACK LAPAROSCOPY BASIN (CUSTOM PROCEDURE TRAY) ×3 IMPLANT
SET TUBE SMOKE EVAC HIGH FLOW (TUBING) ×3 IMPLANT
SLEEVE Z-THREAD 5X100MM (TROCAR) IMPLANT
STRIP CLOSURE SKIN 1/2X4 (GAUZE/BANDAGES/DRESSINGS) ×3 IMPLANT
SUT MNCRL AB 3-0 PS2 27 (SUTURE) ×3 IMPLANT
SUT VICRYL 0 UR6 27IN ABS (SUTURE) ×3 IMPLANT
SYSTEM BAG RETRIEVAL 10MM (BASKET) IMPLANT
TOWEL GREEN STERILE FF (TOWEL DISPOSABLE) ×3 IMPLANT
TROCAR 11X100 Z THREAD (TROCAR) ×3 IMPLANT
TROCAR XCEL NON-BLD 5MMX100MML (ENDOMECHANICALS) ×3 IMPLANT
WARMER LAPAROSCOPE (MISCELLANEOUS) ×3 IMPLANT

## 2024-01-04 NOTE — OR Nursing (Signed)
 IUD REMOVED BY DR Cipriano Creeks

## 2024-01-04 NOTE — Anesthesia Procedure Notes (Signed)
 Procedure Name: Intubation Date/Time: 01/04/2024 7:44 AM  Performed by: [id number] Sheyann Sulton A, CRNAPre-anesthesia Checklist: Patient identified, Emergency Drugs available, Suction available, Patient being monitored and Timeout performed Patient Re-evaluated:Patient Re-evaluated prior to induction Oxygen Delivery Method: Circle system utilized Preoxygenation: Pre-oxygenation with 100% oxygen Induction Type: IV induction Ventilation: Mask ventilation without difficulty Laryngoscope Size: 4 and Mac Grade View: Grade I Tube type: Oral Number of attempts: 1 Placement Confirmation: ETT inserted through vocal cords under direct vision, positive ETCO2 and CO2 detector Secured at: 20 cm Tube secured with: Tape

## 2024-01-04 NOTE — Progress Notes (Signed)
 No change in H&P.  Mitchel Honour, DO

## 2024-01-04 NOTE — Op Note (Signed)
 Wanda Fleming 01/04/2024  PREOPERATIVE DIAGNOSIS:  Desires for sterility, dysmenorrhea  POSTOPERATIVE DIAGNOSIS:  SAA  PROCEDURE:  Diagnostic laparoscopy, bilateral salpingectomy, Paragard IUD removal  ANESTHESIA:  General endotracheal  COMPLICATIONS:  None immediate.  PATHOLOGY:  Bilateral fallopian tubes  ESTIMATED BLOOD LOSS:  Less than 20 ml.  INDICATIONS: 38 y.o.  with desire for sterility and dysmenorrhea    FINDINGS:  Normal uterus.  Normal right ovary and fallopian tube.  Left fallopian tube and ovary with adhesions to ovarian fossa.  Ovary immobile secondary to adhesions.  Normal liver edge and gallbladder.  Retrocecal appendix.  No visible endometriosis.  TECHNIQUE:  The patient was taken to the operating room where general anesthesia was obtained without difficulty.  She was then placed in the dorsal lithotomy position and prepared and draped in sterile fashion.  After an adequate timeout was performed, bladder was catheterized and drained of 25 cc clear urine.  A bivalved speculum was then placed in the patient's vagina.  IUD strings were visualized and IUD was easily removed (not send for pathology).  The anterior lip of cervix grasped with the single-tooth tenaculum.  The hulka clip was advanced into the uterus.  The speculum was removed from the vagina.  Attention was then turned to the patient's abdomen where a 10-mm skin incision was made on the umbilical fold.  The Veress needle was carefully introduced into the peritoneal cavity through the abdominal wall.  Intraperitoneal placement was confirmed by drop in intraabdominal pressure with insufflation of carbon dioxide gas.  Adequate pneumoperitoneum was obtained, and the 10/11 XL trocar was then advanced without difficulty into the abdomen where intraabdominal placement was confirmed by the operative laparoscope.  A second suprapubic incision was made and 5 mm trocar was advanced without difficulty under direct visualization  using the laparoscope.  The above dictated findings were noted.  The Gyrus was advanced into the pelvis and the right fallopian tube was removed using serial clamp, coagulate, cut bites to free it from the mesosalpinx and uterine cornu.  The fallopian tube was removed from the pelvis and sent for pathology.  The left fallopian tube was removed after taking down adhesions to the left ovary in the same fashion.  This fallopian tube was also removed and sent for pathology.  Hemostasis was observed.  Bilateral ureteral peristalsis was observed.  All instruments were removed from the pelvis.  Umbilical fascial incision was closed using 0 vicryl in a figure of eight stitch.  Both skin incisions were closed using 3-0 monocryl in a subcuticular fashion.  Dermabond was applied.    The uterine manipulator and the tenaculum were removed from the vagina without complications. The patient tolerated the procedure well.  Sponge, lap, and needle counts were correct times two.  The patient was then taken to the recovery room awake, extubated and in stable  in stable condition.

## 2024-01-04 NOTE — Transfer of Care (Signed)
 Immediate Anesthesia Transfer of Care Note  Patient: Wanda Fleming  Procedure(s) Performed: REMOVAL, INTRAUTERINE DEVICE (Uterus) SALPINGECTOMY, BILATERAL, LAPAROSCOPIC (Bilateral: Abdomen) EXCISION, ENDOMETRIOSIS, LAPAROSCOPIC (Abdomen)  Patient Location: PACU  Anesthesia Type:General  Level of Consciousness: awake  Airway & Oxygen Therapy: Patient Spontanous Breathing  Post-op Assessment: Report given to RN  Post vital signs: stable  Last Vitals:  Vitals Value Taken Time  BP 123/66 01/04/24 0854  Temp 36.5 01/04/24   0854  Pulse 85 01/04/24 0858  Resp 22 01/04/24 0858  SpO2 96 % 01/04/24 0858  Vitals shown include unfiled device data.  Last Pain:  Vitals:   01/04/24 0606  TempSrc: (P) Oral         Complications: No notable events documented.

## 2024-01-04 NOTE — Discharge Instructions (Signed)
Call MD for T>100.4, heavy vaginal bleeding, severe abdominal pain, intractable nausea and/or vomiting, or respiratory distress.  Call office to schedule postop visit in 2 weeks.  Pelvic rest x 4 weeks.  No driving while taking narcotics.

## 2024-01-05 ENCOUNTER — Encounter (HOSPITAL_COMMUNITY): Payer: Self-pay | Admitting: Obstetrics & Gynecology

## 2024-01-05 LAB — SURGICAL PATHOLOGY

## 2024-01-05 NOTE — Anesthesia Postprocedure Evaluation (Signed)
 Anesthesia Post Note  Patient: Wanda Fleming  Procedure(s) Performed: REMOVAL, INTRAUTERINE DEVICE (Uterus) SALPINGECTOMY, BILATERAL, LAPAROSCOPIC (Bilateral: Abdomen) EXCISION, ENDOMETRIOSIS, LAPAROSCOPIC (Abdomen)     Patient location during evaluation: PACU Anesthesia Type: General Level of consciousness: sedated and patient cooperative Pain management: pain level controlled Vital Signs Assessment: post-procedure vital signs reviewed and stable Respiratory status: spontaneous breathing Cardiovascular status: stable Anesthetic complications: no   No notable events documented.  Last Vitals:  Vitals:   01/04/24 0915 01/04/24 0930  BP: 112/68 116/70  Pulse: 87 63  Resp: 18 16  Temp:  36.4 C  SpO2: 92% 94%    Last Pain:  Vitals:   01/04/24 0930  TempSrc:   PainSc: 3                  Gorman Laughter

## 2024-01-10 DIAGNOSIS — F411 Generalized anxiety disorder: Secondary | ICD-10-CM | POA: Diagnosis not present

## 2024-01-24 DIAGNOSIS — F411 Generalized anxiety disorder: Secondary | ICD-10-CM | POA: Diagnosis not present

## 2024-01-30 ENCOUNTER — Encounter: Payer: Self-pay | Admitting: Family Medicine

## 2024-01-30 NOTE — Progress Notes (Unsigned)
 Chief Complaint  Patient presents with   Annual Exam    Fasting AWV Did not get flu shot in fall, Did get covid,     Wanda Fleming is a 38 y.o. female who presents for a complete physical.    She underwent diagnostic laparoscopy, bilateral salpingectomy, Paragard IUD removal last month (12/2023). They did not see any endometriosis at the time of the laparoscopy.  Her current cycle (1st since surgery) started today, 5 days late.  No cramping yet. She has some stitches poking through, would like it checked.  She periodically get a random fever, temp of 100, lasts 1-2 days with no associated symptoms. Last time this occurred was 1.5 weeks ago (about 2 weeks postop), only lasted a day. This time she had a mild headache.  Once was related to ovulation, doesn't think it was this time.  Last year she reported some fatigue.  She reports her sleep has improved, sometimes has mild fatigue. Energy is better when she is more active. She is generally refreshed in the mornings.  Tension, headaches--overall better. She gets monthly migraine protocol massages, and doesn't get tension headaches nearly as often. This also helps with neck/jaw tension. She wakes up some mornings with jaw stiffness from clenching/grinding.  Diet--overall healthy, not always eating enough veggies. Occ skips meals.  Limited fast food, fries once a week. She is cooking more this year.  Having more [place] cooking (cheese/butter/heavy cream), as her boyfriend has been cooking this on the weekends.  H/o hand eczema, treated with TAC 0.1% prn with good results in the past, hasn't needed any in the last few years.  H/o Vitamin D  deficiency--Last level was normal at 37.5 in 01/2022, when taking 2000 IU of D3 nightly. She is currently taking 2000 IU tablet, but not much in the last month (since surgery), and only a few times a week before that.  Anxiety--She is getting counseling monthly, as well as using safe and sound protocol,  sessions every 2 weeks. Overall she reports doing well.  Family h/o cerebral aneurysms. She underwent screening MRA in 06/2020 which did not show any aneurysm. She denies headaches other than those mentioned above.  H/o mild hyperlipidemia. Improved on last check in 08/2022.  Lipoprotein (a) was elevated. Average risk hs-CRP. Having more heavy cream/cheese/butter recently.  Due for recheck. Has red meat 1x/week or less.  2 eggs/week (not cooked in things, for breakfast). Cheese 2x/week. Drinks almondmilk.  Lab Results  Component Value Date   CHOL 177 09/06/2022   HDL 54 09/06/2022   LDLCALC 111 (H) 09/06/2022   TRIG 61 09/06/2022   CHOLHDL 3.3 09/06/2022    Component Ref Range & Units 08/2022  Lipoprotein (a) <75.0 nmol/L 127.2 High    Component Ref Range & Units 08/2022  Apolipoprotein A-1 116 - 209 mg/dL 295  Apolipoprotein B <62 mg/dL 91 High   Comment:                          Desirable               < 90                          Borderline High     90 -  99                          High  100 - 130                          Very High               >130   Component Ref Range & Units 08/2022  CRP, High Sensitivity 0.00 - 3.00 mg/L 2.30  Comment:          Relative Risk for Future Cardiovascular Event                              Low                 <1.00                              Average       1.00 - 3.00                              High                >3.00     Immunization History  Administered Date(s) Administered   Influenza Inj Mdck Quad Pf 06/12/2018   Influenza,inj,Quad PF,6+ Mos 05/24/2019   Influenza-Unspecified 05/03/2020, 07/30/2021   Moderna SARS-COV2 Booster Vaccination 07/02/2021   Moderna Sars-Covid-2 Vaccination 11/01/2019, 11/29/2019, 07/17/2020   Tdap 07/16/2018  She had a COVID booster, didn't get a flu shot this past year Last Pap smear: 08/2022 through GYN Last mammogram: never. She had negative BRCA gene testing in past due to  FHx Last colonoscopy: never Last DEXA: never Dentist: twice a year Ophtho: yearly Exercise: walking 1 mile/day with the dog (less since surgery, sometimes longer walks). Uses punching bag some. Weights off/on, not regularly. She did a duathalon in 09/2023.  She had genetic testing (BRCA and others) through her GYN, reportedly normal.   PMH, PSH, SH and FH reviewed and updated  Outpatient Encounter Medications as of 01/31/2024  Medication Sig   Bioflavonoid Products (QUERCETIN COMPLEX IMMUNE PO) Take 1 drop by mouth daily.   Cholecalciferol (VITAMIN D ) 50 MCG (2000 UT) CAPS Take 2,000 Units by mouth 3 (three) times a week.   Ferrous Sulfate Dried (SLOW RELEASE IRON) 45 MG TBCR Take 1 tablet by mouth See admin instructions. 1 tab daily during menses cycle   No facility-administered encounter medications on file as of 01/31/2024.   Allergies  Allergen Reactions   Ibuprofen Nausea And Vomiting   Iodine  Rash    ROS:  The patient denies anorexia, fever, vision changes, decreased hearing, ear pain, sore throat, breast concerns, chest pain, palpitations, dizziness, syncope, dyspnea on exertion, cough, swelling, nausea, vomiting, diarrhea, constipation, abdominal pain, melena, hematochezia, indigestion/heartburn, hematuria, incontinence, dysuria, irregular menstrual cycles, vaginal discharge, odor or itch, joint pains, numbness, tingling, weakness, tremor, suspicious skin lesions, depression, abnormal bleeding/bruising, or enlarged lymph nodes. No flares of eczema in the last few years Menses had been heavy/painful, lasting 9d.  Just had IUD removed, first cycle started today. Prominent veins on legs, unchanged; not itchy or painful. Anxiety and tension headaches are improved/controlled Gets Mittelschmerz monthly, very painful.    PHYSICAL EXAM:  BP 120/68   Pulse 72   Ht 5' 7.5 (1.715 m)   Wt 184 lb (83.5 kg)   LMP 01/31/2024 (Exact Date)   BMI 28.39 kg/m  Wt Readings from Last  3 Encounters:  01/31/24 184 lb (83.5 kg)  01/04/24 (P) 185 lb (83.9 kg)  01/26/23 186 lb 12.8 oz (84.7 kg)    General Appearance:    Alert, cooperative, no distress, appears stated age. She didn't change into gown.  Head:    Normocephalic, without obvious abnormality, atraumatic  Eyes:    PERRL, conjunctiva/corneas clear, EOM's intact, fundi benign  Ears:    Normal TM's and external ear canals  Nose:   Normal, no drainage or sinus tenderness  Throat:   Normal mucosa, no lesions or erythema.   Neck:   Supple, no lymphadenopathy;  thyroid:  no enlargement/ tenderness/nodules; no carotid bruit or JVD  Back:    Spine nontender, no curvature, no CVA  tenderness  Lungs:     Clear to auscultation bilaterally without wheezes, rales or  ronchi; respirations unlabored  Chest Wall:    No tenderness or deformity   Heart:    Regular rate and rhythm, S1 and S2 normal, no murmur, rub   or gallop  Breast Exam:    Deferred to GYN  Abdomen:     Soft, non-tender, nondistended, normoactive bowel sounds,   no masses, no hepatosplenomegaly  Genitalia:    Deferred to GYN       Extremities:   No clubbing, cyanosis or edema  Pulses:   2+ and symmetric all extremities  Skin:   Skin color, texture, turgor normal, no rashes or lesions. Limited evaluation, not in gown.  Lymph nodes:   Cervical, supraclavicular nodes normal  Neurologic:   Normal strength, sensation and gait; reflexes 2+ and symmetric throughout                               Psych:   Normal mood, affect, hygiene and grooming.   Healing incision midline lower umbilicus, and lower abdomen, left side of imdline, which has some palpable sutures poking through  More focal vascular area in a round formation behind L thigh Superifical veins scattered throughout legs  ASSESSMENT/PLAN:    Last year discussed fatigue, sleep study. Insurance is changing in July, preferred to wait.   Discussed monthly self breast exams and yearly mammograms after the  age of 24; at least 30 minutes of aerobic activity at least 5 days/week, weight-bearing exercise at least 2x/week; proper sunscreen use reviewed; healthy diet, including goals of calcium and vitamin D  intake and alcohol recommendations (less than or equal to 1 drink/day) reviewed; regular seatbelt use; changing batteries in smoke detectors, carbon monoxide detectors.  Immunization recommendations discussed--yearly flu shots recommended.  Colonoscopy recommendations reviewed (age 23)  F/u 1 year, sooner prn.

## 2024-01-30 NOTE — Patient Instructions (Addendum)
  HEALTH MAINTENANCE RECOMMENDATIONS:  It is recommended that you get at least 30 minutes of aerobic exercise at least 5 days/week (for weight loss, you may need as much as 60-90 minutes). This can be any activity that gets your heart rate up. This can be divided in 10-15 minute intervals if needed, but try and build up your endurance at least once a week.  Weight bearing exercise is also recommended twice weekly.  Eat a healthy diet with lots of vegetables, fruits and fiber.  Colorful foods have a lot of vitamins (ie green vegetables, tomatoes, red peppers, etc).  Limit sweet tea, regular sodas and alcoholic beverages, all of which has a lot of calories and sugar.  Up to 1 alcoholic drink daily may be beneficial for women (unless trying to lose weight, watch sugars).  Drink a lot of water.  Calcium recommendations are 1200-1500 mg daily (1500 mg for postmenopausal women or women without ovaries), and vitamin D  1000 IU daily.  This should be obtained from diet and/or supplements (vitamins), and calcium should not be taken all at once, but in divided doses.  Monthly self breast exams and yearly mammograms for women over the age of 94 is recommended.  Sunscreen of at least SPF 30 should be used on all sun-exposed parts of the skin when outside between the hours of 10 am and 4 pm (not just when at beach or pool, but even with exercise, golf, tennis, and yard work!)  Use a sunscreen that says broad spectrum so it covers both UVA and UVB rays, and make sure to reapply every 1-2 hours.  Remember to change the batteries in your smoke detectors when changing your clock times in the spring and fall. Carbon monoxide detectors are recommended for your home.  Use your seat belt every time you are in a car, and please drive safely and not be distracted with cell phones and texting while driving.  Contact us  for evaluation if you have fever that PERSIST (lasting 3-4 days, getting worse/higher, or with any  symptoms associated with it--abdominal pain, vomiting, etc).

## 2024-01-31 ENCOUNTER — Ambulatory Visit: Payer: BC Managed Care – PPO | Admitting: Family Medicine

## 2024-01-31 ENCOUNTER — Encounter: Payer: Self-pay | Admitting: Family Medicine

## 2024-01-31 VITALS — BP 120/68 | HR 72 | Ht 67.5 in | Wt 184.0 lb

## 2024-01-31 DIAGNOSIS — E785 Hyperlipidemia, unspecified: Secondary | ICD-10-CM

## 2024-01-31 DIAGNOSIS — E7841 Elevated Lipoprotein(a): Secondary | ICD-10-CM

## 2024-01-31 DIAGNOSIS — F411 Generalized anxiety disorder: Secondary | ICD-10-CM | POA: Diagnosis not present

## 2024-01-31 DIAGNOSIS — Z0000 Encounter for general adult medical examination without abnormal findings: Secondary | ICD-10-CM | POA: Diagnosis not present

## 2024-01-31 DIAGNOSIS — E559 Vitamin D deficiency, unspecified: Secondary | ICD-10-CM | POA: Diagnosis not present

## 2024-01-31 DIAGNOSIS — Z5181 Encounter for therapeutic drug level monitoring: Secondary | ICD-10-CM | POA: Diagnosis not present

## 2024-02-01 ENCOUNTER — Ambulatory Visit: Payer: Self-pay | Admitting: Family Medicine

## 2024-02-01 LAB — LIPID PANEL
Chol/HDL Ratio: 4.3 ratio (ref 0.0–4.4)
Cholesterol, Total: 216 mg/dL — ABNORMAL HIGH (ref 100–199)
HDL: 50 mg/dL (ref >39)
LDL Chol Calc (NIH): 143 mg/dL — ABNORMAL HIGH (ref 0–99)
Triglycerides: 131 mg/dL (ref 0–149)
VLDL Cholesterol Cal: 23 mg/dL (ref 5–40)

## 2024-02-01 LAB — VITAMIN D 25 HYDROXY (VIT D DEFICIENCY, FRACTURES): Vit D, 25-Hydroxy: 37.4 ng/mL (ref 30.0–100.0)

## 2024-02-01 LAB — COMPREHENSIVE METABOLIC PANEL WITH GFR
ALT: 9 IU/L (ref 0–32)
AST: 16 IU/L (ref 0–40)
Albumin: 4.6 g/dL (ref 3.9–4.9)
Alkaline Phosphatase: 87 IU/L (ref 44–121)
BUN/Creatinine Ratio: 16 (ref 9–23)
BUN: 11 mg/dL (ref 6–20)
Bilirubin Total: 0.3 mg/dL (ref 0.0–1.2)
CO2: 21 mmol/L (ref 20–29)
Calcium: 9.7 mg/dL (ref 8.7–10.2)
Chloride: 101 mmol/L (ref 96–106)
Creatinine, Ser: 0.67 mg/dL (ref 0.57–1.00)
Globulin, Total: 2.9 g/dL (ref 1.5–4.5)
Glucose: 87 mg/dL (ref 70–99)
Potassium: 4.6 mmol/L (ref 3.5–5.2)
Sodium: 140 mmol/L (ref 134–144)
Total Protein: 7.5 g/dL (ref 6.0–8.5)
eGFR: 116 mL/min/{1.73_m2} (ref >59)

## 2024-02-09 DIAGNOSIS — F411 Generalized anxiety disorder: Secondary | ICD-10-CM | POA: Diagnosis not present

## 2024-03-20 DIAGNOSIS — F411 Generalized anxiety disorder: Secondary | ICD-10-CM | POA: Diagnosis not present

## 2024-04-28 ENCOUNTER — Encounter: Payer: Self-pay | Admitting: Family Medicine

## 2024-06-07 DIAGNOSIS — F411 Generalized anxiety disorder: Secondary | ICD-10-CM | POA: Diagnosis not present

## 2025-02-12 ENCOUNTER — Encounter: Payer: Self-pay | Admitting: Family Medicine
